# Patient Record
Sex: Male | Born: 1955 | Race: White | Hispanic: No | Marital: Married | Smoking: Former smoker
Health system: Southern US, Community
[De-identification: ages and names within clinical notes are randomized; demographics above are authoritative.]

## PROBLEM LIST (undated history)

## (undated) DIAGNOSIS — C44310 Basal cell carcinoma of skin of unspecified parts of face: Secondary | ICD-10-CM

## (undated) DIAGNOSIS — C4490 Unspecified malignant neoplasm of skin, unspecified: Secondary | ICD-10-CM

## (undated) DIAGNOSIS — K5730 Diverticulosis of large intestine without perforation or abscess without bleeding: Secondary | ICD-10-CM

## (undated) DIAGNOSIS — M545 Low back pain: Secondary | ICD-10-CM

## (undated) DIAGNOSIS — M5137 Other intervertebral disc degeneration, lumbosacral region: Secondary | ICD-10-CM

## (undated) DIAGNOSIS — C4401 Basal cell carcinoma of skin of lip: Secondary | ICD-10-CM

## (undated) HISTORY — DX: Unspecified malignant neoplasm of skin, unspecified: C44.90

## (undated) HISTORY — DX: Low back pain: M54.5

## (undated) HISTORY — DX: Diverticulosis of large intestine without perforation or abscess without bleeding: K57.30

## (undated) HISTORY — PX: INGUINAL HERNIA REPAIR: SHX194

## (undated) HISTORY — DX: Other intervertebral disc degeneration, lumbosacral region: M51.37

## (undated) HISTORY — DX: Basal cell carcinoma of skin of unspecified parts of face: C44.310

## (undated) HISTORY — PX: OTHER SURGICAL HISTORY: SHX169

## (undated) HISTORY — PX: COLONOSCOPY: SHX174

## (undated) HISTORY — PX: TONSILLECTOMY AND ADENOIDECTOMY: SHX28

---
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
## 2004-07-10 ENCOUNTER — Ambulatory Visit: Payer: Self-pay | Admitting: Internal Medicine

## 2004-09-20 ENCOUNTER — Ambulatory Visit: Payer: Self-pay | Admitting: Internal Medicine

## 2004-11-11 ENCOUNTER — Ambulatory Visit: Payer: Self-pay | Admitting: Internal Medicine

## 2006-07-21 ENCOUNTER — Ambulatory Visit: Payer: Self-pay | Admitting: Internal Medicine

## 2007-11-03 ENCOUNTER — Encounter: Payer: Self-pay | Admitting: *Deleted

## 2007-11-03 DIAGNOSIS — K5730 Diverticulosis of large intestine without perforation or abscess without bleeding: Secondary | ICD-10-CM

## 2007-11-03 DIAGNOSIS — Z9889 Other specified postprocedural states: Secondary | ICD-10-CM | POA: Insufficient documentation

## 2007-11-03 DIAGNOSIS — Z9089 Acquired absence of other organs: Secondary | ICD-10-CM | POA: Insufficient documentation

## 2007-11-03 HISTORY — DX: Diverticulosis of large intestine without perforation or abscess without bleeding: K57.30

## 2008-03-28 ENCOUNTER — Encounter: Payer: Self-pay | Admitting: Internal Medicine

## 2008-09-07 ENCOUNTER — Ambulatory Visit: Payer: Self-pay | Admitting: Internal Medicine

## 2008-09-07 DIAGNOSIS — M51379 Other intervertebral disc degeneration, lumbosacral region without mention of lumbar back pain or lower extremity pain: Secondary | ICD-10-CM | POA: Insufficient documentation

## 2008-09-07 DIAGNOSIS — C4490 Unspecified malignant neoplasm of skin, unspecified: Secondary | ICD-10-CM

## 2008-09-07 DIAGNOSIS — M5137 Other intervertebral disc degeneration, lumbosacral region: Secondary | ICD-10-CM

## 2008-09-07 HISTORY — DX: Unspecified malignant neoplasm of skin, unspecified: C44.90

## 2008-09-07 HISTORY — DX: Other intervertebral disc degeneration, lumbosacral region: M51.37

## 2008-09-07 HISTORY — DX: Other intervertebral disc degeneration, lumbosacral region without mention of lumbar back pain or lower extremity pain: M51.379

## 2008-09-07 LAB — CONVERTED CEMR LAB
ALT: 16 units/L (ref 0–53)
Bilirubin, Direct: 0.1 mg/dL (ref 0.0–0.3)
CO2: 30 meq/L (ref 19–32)
Calcium: 9.5 mg/dL (ref 8.4–10.5)
Creatinine, Ser: 0.9 mg/dL (ref 0.4–1.5)
Glucose, Bld: 93 mg/dL (ref 70–99)
Sodium: 141 meq/L (ref 135–145)
Total Protein: 6.8 g/dL (ref 6.0–8.3)

## 2008-11-27 ENCOUNTER — Encounter: Payer: Self-pay | Admitting: Internal Medicine

## 2008-12-18 ENCOUNTER — Ambulatory Visit: Payer: Self-pay | Admitting: Internal Medicine

## 2008-12-18 DIAGNOSIS — M545 Low back pain, unspecified: Secondary | ICD-10-CM

## 2008-12-18 HISTORY — DX: Low back pain, unspecified: M54.50

## 2008-12-21 ENCOUNTER — Ambulatory Visit: Payer: Self-pay | Admitting: Internal Medicine

## 2008-12-24 ENCOUNTER — Encounter: Payer: Self-pay | Admitting: Internal Medicine

## 2010-08-14 IMAGING — CR DG LUMBAR SPINE COMPLETE W/ BEND
7 series · 7 of 7 positions shown · non-contrast
Comparison: None

CLINICAL DATA: Chronic low back pain, no recent injury

LUMBAR SPINE - COMPLETE WITH BENDING VIEWS

[view not recorded (1 of 7)]
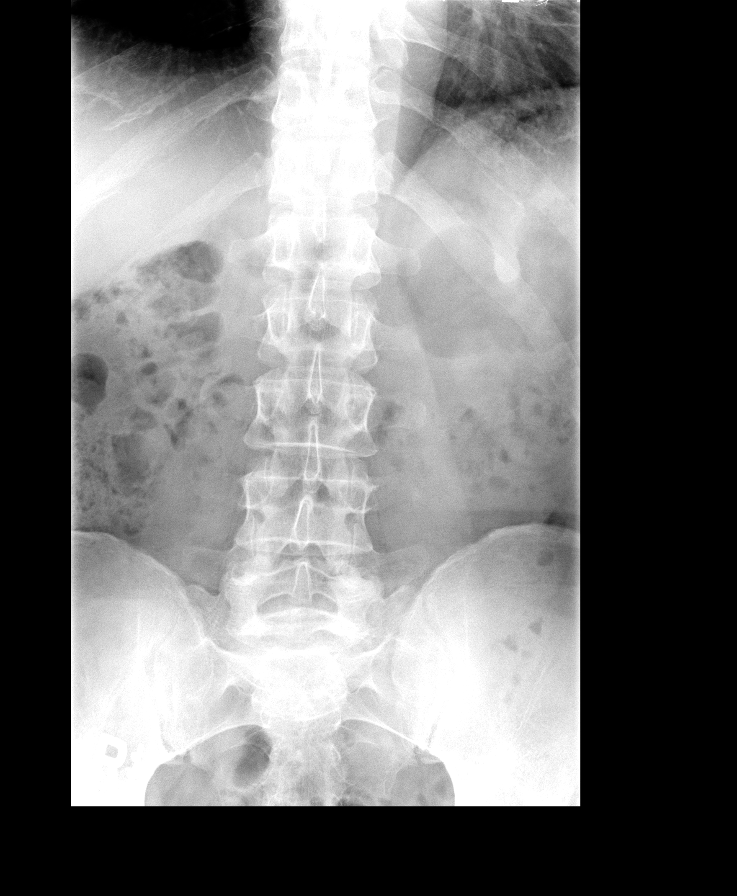

[view not recorded (2 of 7)]
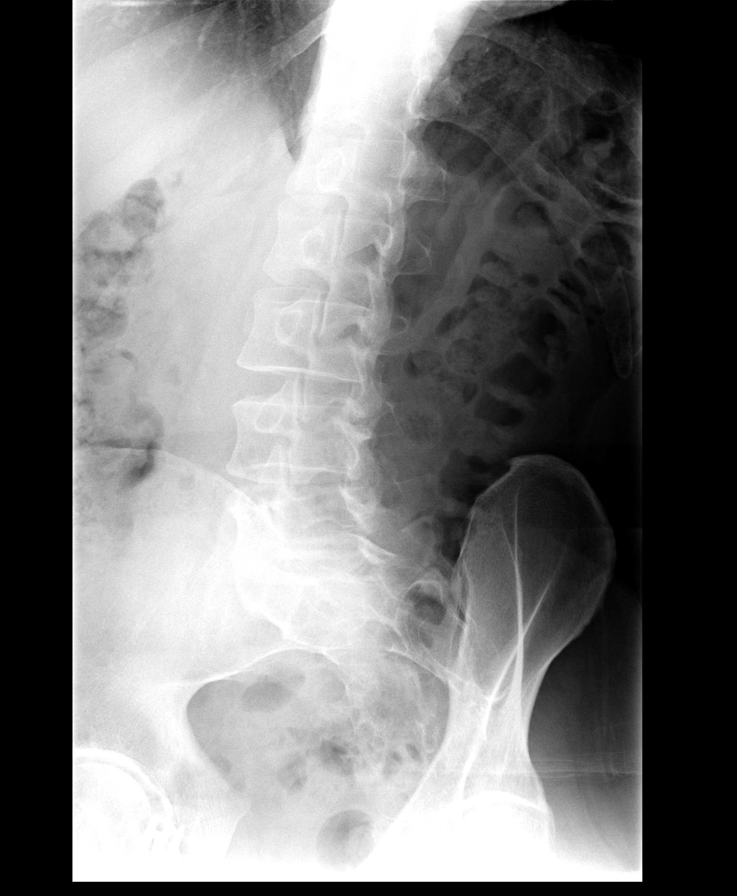

[view not recorded (3 of 7)]
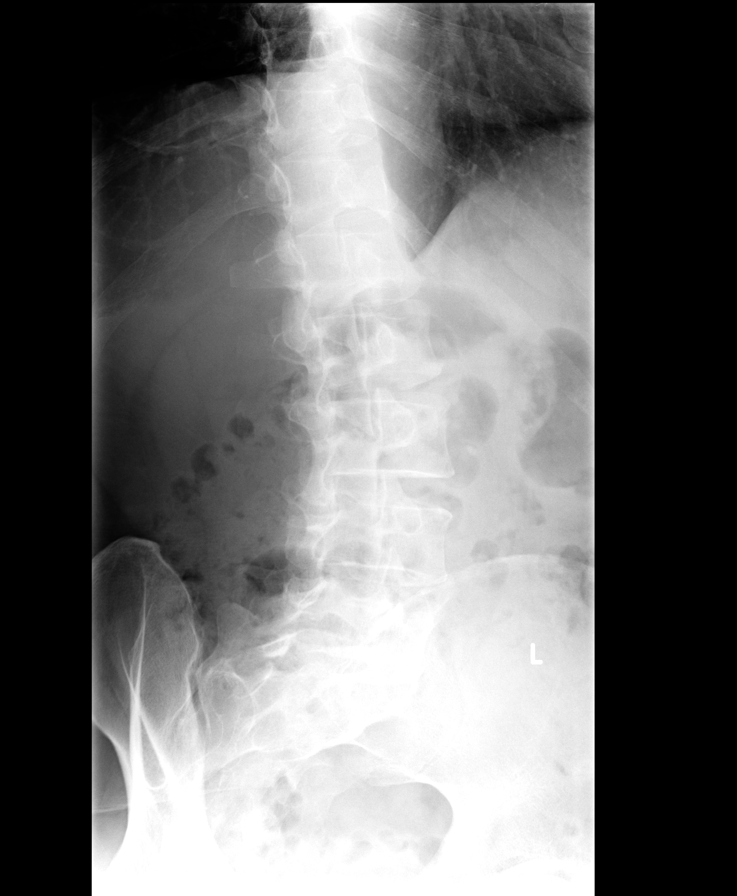

[view not recorded (4 of 7)]
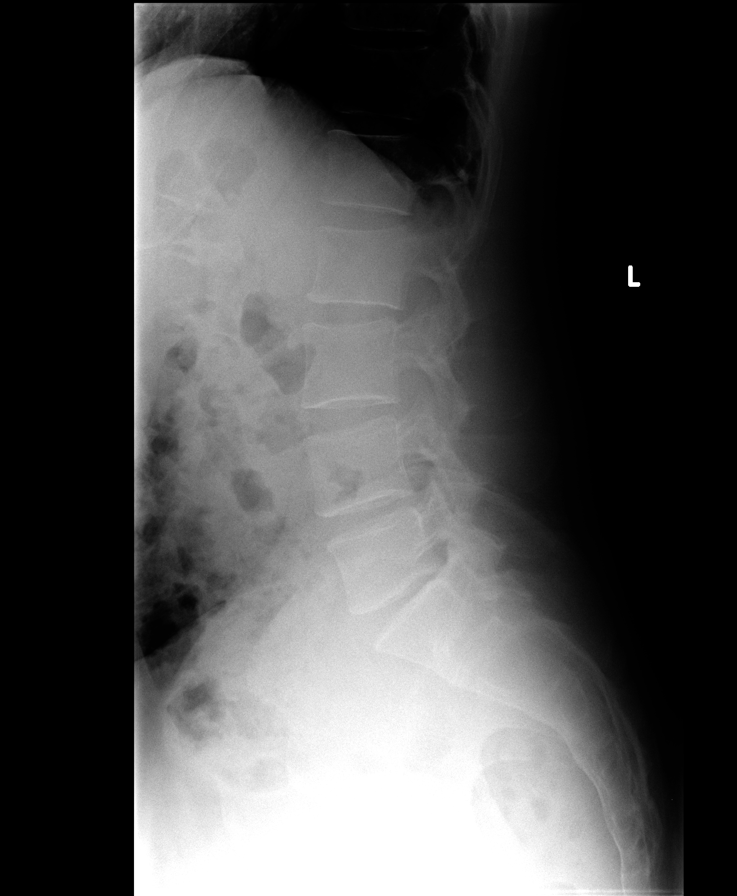

[view not recorded (5 of 7)]
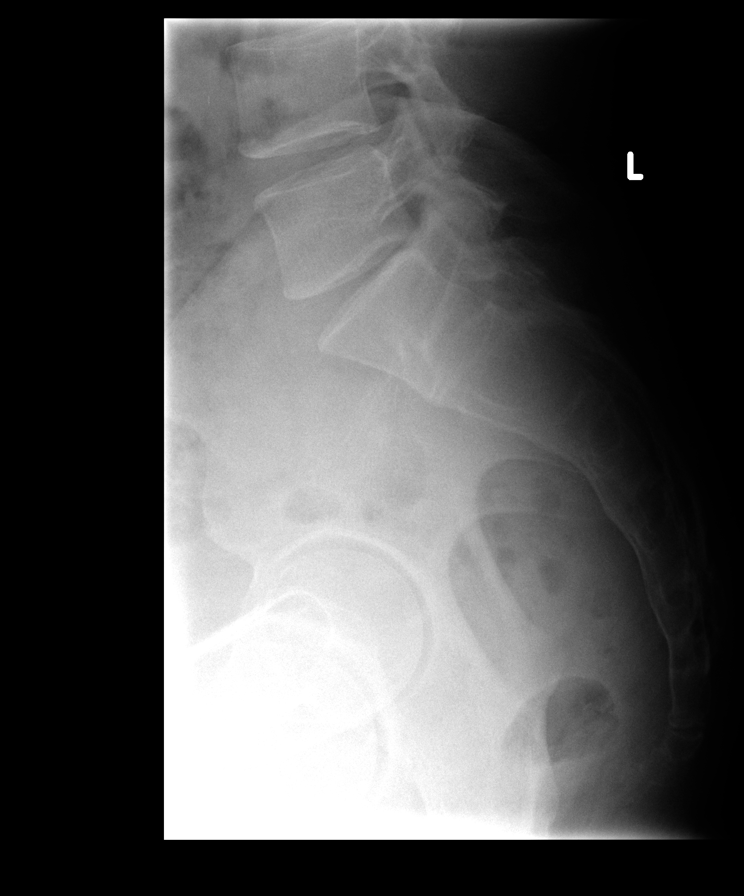

[view not recorded (6 of 7)]
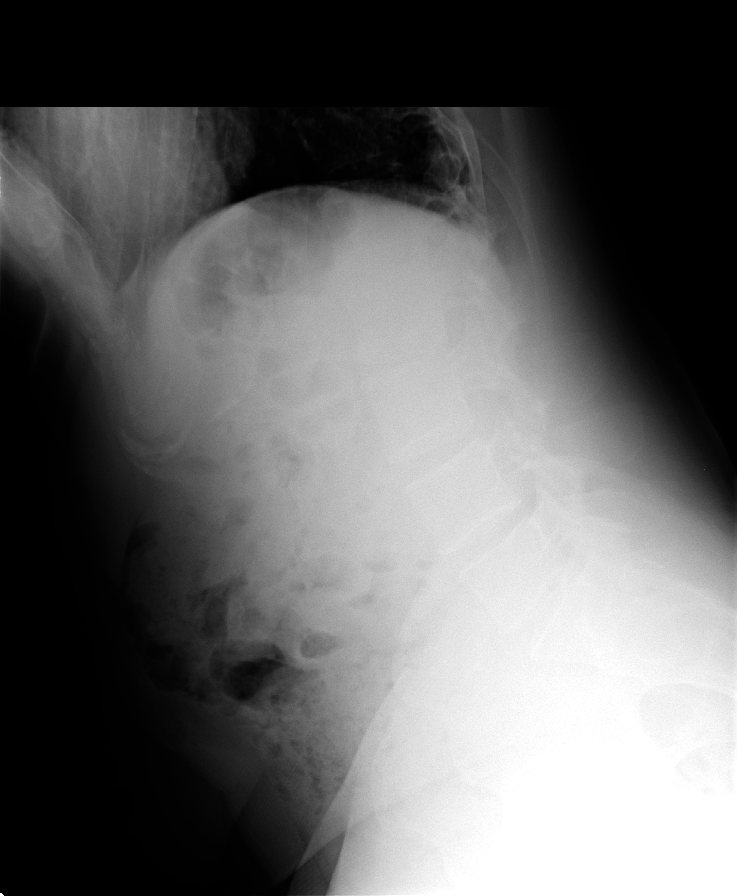

[view not recorded (7 of 7)]
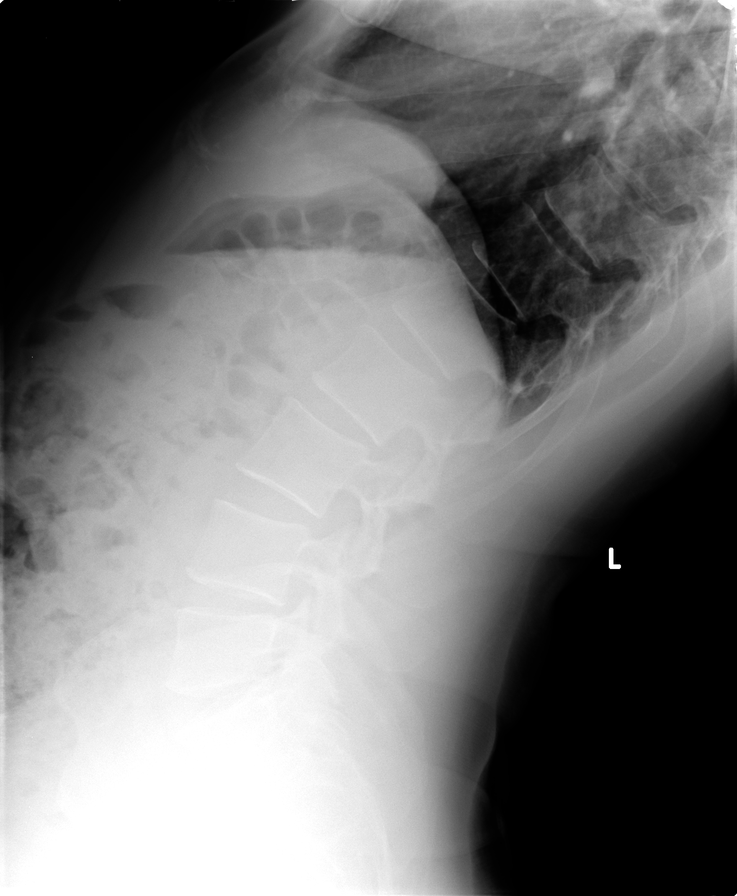

[7 of 7 positions shown; findings below may reference images not displayed]

FINDINGS: The lumbar vertebrae are in normal alignment.  There is
slight decrease in disc space at the L5 S1 level.  The remainder of
disc spaces are normal.  On oblique views no significant
degenerative change is seen involving the facet joints.  The SI
joints appear normal.

Through flexion and extension there is normal range of motion with
no malalignment.
IMPRESSION: 1.  Mild degenerative disc disease at L5-S1.  Normal alignment.
2.  Normal range of motion through flexion and extension.

## 2011-03-11 ENCOUNTER — Telehealth: Payer: Self-pay | Admitting: *Deleted

## 2011-03-11 NOTE — Telephone Encounter (Signed)
Pt is wanting to have his physical the same day as his wife would you allow a work in on Aug 23. You already have two physicals that afternoon. Please advise

## 2011-03-11 NOTE — Telephone Encounter (Signed)
Sure, why not 

## 2011-03-13 NOTE — Telephone Encounter (Signed)
Pt informed

## 2011-04-10 ENCOUNTER — Encounter: Payer: Self-pay | Admitting: Internal Medicine

## 2011-04-10 ENCOUNTER — Ambulatory Visit (INDEPENDENT_AMBULATORY_CARE_PROVIDER_SITE_OTHER): Payer: PRIVATE HEALTH INSURANCE | Admitting: Internal Medicine

## 2011-04-10 VITALS — BP 100/64 | HR 56 | Temp 98.6°F | Wt 192.0 lb

## 2011-04-10 DIAGNOSIS — Z0000 Encounter for general adult medical examination without abnormal findings: Secondary | ICD-10-CM

## 2011-04-10 DIAGNOSIS — Z136 Encounter for screening for cardiovascular disorders: Secondary | ICD-10-CM

## 2011-04-10 DIAGNOSIS — C4490 Unspecified malignant neoplasm of skin, unspecified: Secondary | ICD-10-CM

## 2011-04-10 LAB — URINALYSIS
Bilirubin (Urine): NEGATIVE
Ketones, urine: NEGATIVE
Nitrite, UA: NEGATIVE
Urobilinogen, Ur: 0.2
pH, Urine: 7.5

## 2011-04-10 LAB — CBC WITH DIFFERENTIAL/PLATELET
Lymphocytes Absolute: 1 /uL
MCH: 32.2
Monocytes Absolute: 0 /uL
Neutrophils Absolute: 2 /uL
Platelets: 179
RDW: 13.5

## 2011-04-10 LAB — TSH
Free Thyroxine Index: 2.3
T3 Uptake: 35
TSH: 1.98
Thyroxine (T4), Neonatal: 6.6

## 2011-04-10 LAB — COMPREHENSIVE METABOLIC PANEL
ALT: 13 U/L (ref 10–40)
AST: 25 U/L
Albumin: 4.8
Alkaline Phosphatase: 54 U/L
BUN/Creatinine Ratio: 13
Calcium: 9.3 mg/dL
Chloride, Serum: 9.3
GGT: 20
Glucose, Fasting: 93 mg/dL (ref 60–109)
LDH 1: 180
Potassium, serum: 4.6

## 2011-04-10 LAB — LIPID PANEL
Chol/HDL Ratio, serum: 2.7
Triglycerides: 86

## 2011-04-10 NOTE — Progress Notes (Signed)
Subjective:    Patient ID: [name] [name], male    DOB: [birth year], 55 y.o.   MRN: [id number]  HPI [name] [name] presents for routine exam. He has been healthy with no problems, injury or surgery.  Past Medical History  Diagnosis Date  . CARCINOMA, BASAL CELL, RECURRENT 09/07/2008  . 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 DIVERTICULOSIS, COLON 11/03/2007  . LOW BACK PAIN, CHRONIC 12/18/2008   Past Surgical History  Procedure Date  . Tonsillectomy and adenoidectomy     age 57  . Inguinal hernia repair     left at age 7 (concomittently with exploratory surgery)  . Exploration for undescended testicle      age 26  . Fractured wrist     at 11  . Fractured patella     teenager   Family History  Problem Relation Age of Onset  . Hyperlipidemia Mother   . Hypertension Father   . Atrial fibrillation Father   . Obesity Father   . Heart disease Father     a. fib  . Cancer Maternal Grandmother     Breast Cancer  . Hyperlipidemia Other   . Diabetes Other    History   Social History  . Marital Status: Single    Spouse Name: N/A    Number of Children: N/A  . Years of Education: N/A   Occupational History  . 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 Smoking status: Former Smoker    Quit date: 04/09/1977  . Smokeless tobacco: Never Used  . Alcohol Use: 10.5 oz/week    21 drink(s) per week  . Drug Use: No  . Sexually Active: Yes -- Male partner(s)   Other Topics Concern  . Not on file   Social History Narrative   [place]-degree Scientist, clinical (histocompatibility and immunogenetics); MBA [employer]. Married '79. 1 daughter -'81 (nurse), 1 son '88. [employer]. 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 Marriage in good health       Review of Systems Review of Systems  Constitutional:  Negative for fever, chills, activity change and unexpected weight change.  HEENT:  Negative for hearing loss, ear pain, congestion, neck  stiffness and postnasal drip. Negative for sore throat or swallowing problems. Negative for dental complaints.   Eyes: Negative for vision loss or change in visual acuity.  Respiratory: Negative for chest tightness and wheezing.   Cardiovascular: Negative for chest pain and palpitation. No decreased exercise tolerance Gastrointestinal: No change in bowel habit. No bloating or gas. No reflux or indigestion Genitourinary: Negative for urgency, frequency, flank pain and difficulty urinating.  Musculoskeletal: Negative for myalgias, back pain, arthralgias and gait problem.  Neurological: Negative for dizziness, tremors, weakness and headaches.  Hematological: Negative for adenopathy.  Psychiatric/Behavioral: Negative for behavioral problems and dysphoric mood.  Derm - several lesions removed lately - basal cell; going for Moh's procedure for lesion right temple  Lab Results  Component Value Date   WBC 3.5 03/26/2011   HGB 14.5 03/26/2011   HCT 42 03/26/2011   TRIG 86 04/10/2011   HDL 77* 04/10/2011   ALT 13 03/26/2011   AST 25 03/26/2011   NA 141 09/07/2008   K 4.2 09/07/2008   CL 104 09/07/2008   CREATININE 1.00 03/26/2011   BUN 13 03/26/2011   CO2 30 09/07/2008   TSH 1.980 03/26/2011   GLUF 93 03/26/2011        Iron  156       LDL                       960  12 lead EKG - sinus bradycardia @ 43, 2nd degree AVB     Objective:   Physical Exam Vital signs reviewed Gen'l: Well nourished well developed white male in no acute distress  HEENT:  Head: Normocephalic and atraumatic.  Right Ear: External ear normal. EAC/TM nl Left Ear: External ear normal.  EAC/TM nl Nose: Nose normal.  Mouth/Throat: Oropharynx is clear and moist. Dentition - native, in good repair. No buccal or palatal lesions. Posterior pharynx clear. Eyes: Conjunctivae and sclera clear. EOM intact. Pupils are equal, round, and reactive to light. Right eye exhibits no discharge. Left eye exhibits no discharge. Neck:  Normal range of motion. Neck supple. No JVD present. No tracheal deviation present. No thyromegaly present.  Cardiovascular: Normal rate, regular rhythm, no gallop, no friction rub, no murmur heard.      Quiet precordium. 2+ radial and DP pulses . No carotid bruits Pulmonary/Chest: Effort normal. No respiratory distress or increased WOB, no wheezes, no rales. No chest wall deformity or CVAT. Abdominal: Soft. Bowel sounds are normal in all quadrants. He exhibits no distension, no tenderness, no rebound or guarding, No heptosplenomegaly  Genitourinary:  normal male Rectal - NST, internal hemorrhoids, prostate nl size, contour, texture Musculoskeletal: Normal range of motion. He exhibits no edema and no tenderness.       Small and large joints without redness, synovial thickening or deformity. Full range of motion preserved about all small, median and large joints.  Lymphadenopathy:    He has no cervical or supraclavicular adenopathy.  Neurological: He is alert and oriented to person, place, and time. CN II-XII intact. DTRs 2+ and symmetrical biceps, radial and patellar tendons. Cerebellar function normal with no tremor, rigidity, normal gait and station.  Skin: Skin is warm and dry. No rash noted. No erythema.  Psychiatric: He has a normal mood and affect. His behavior is normal. Thought content normal.         Assessment & Plan:

## 2011-04-13 DIAGNOSIS — Z0000 Encounter for general adult medical examination without abnormal findings: Secondary | ICD-10-CM | POA: Insufficient documentation

## 2011-04-13 NOTE — Assessment & Plan Note (Signed)
He is current with his dermatologist and is scheduled for a Moh's excision of lesion on his right forehead. Explained Moh's procedure to him.

## 2011-04-13 NOTE — Assessment & Plan Note (Signed)
Interval history significant for recurrent skin lesions but no other medical problems. Physical exam is unremarkable. Lab results are in normal limits. He is current with colorectal cancer screening - last study March '06. Immunizations - he is due for Tdap, which I believe he has had this year at the [facility] department. 12 lead EKG with 2nd degree AV block. This was discussed and he is reassured that this is not, in itself, pathologic. Recommended a repeat EKG in 1 year and he is advised to be alert to symptoms of near-syncope, decreased exercise tolerance, bradycardia. He is to return in 1 year for EKG and limited exam and to return in 2 years for full labs and complete exam.

## 2011-04-28 ENCOUNTER — Encounter: Payer: Self-pay | Admitting: Internal Medicine

## 2012-07-29 ENCOUNTER — Ambulatory Visit (INDEPENDENT_AMBULATORY_CARE_PROVIDER_SITE_OTHER): Payer: PRIVATE HEALTH INSURANCE | Admitting: Internal Medicine

## 2012-07-29 ENCOUNTER — Encounter: Payer: Self-pay | Admitting: Internal Medicine

## 2012-07-29 VITALS — BP 120/72 | HR 84 | Temp 96.9°F | Resp 10 | Ht 69.0 in | Wt 182.0 lb

## 2012-07-29 DIAGNOSIS — M545 Low back pain: Secondary | ICD-10-CM

## 2012-07-29 DIAGNOSIS — Z0000 Encounter for general adult medical examination without abnormal findings: Secondary | ICD-10-CM

## 2012-07-29 DIAGNOSIS — M25512 Pain in left shoulder: Secondary | ICD-10-CM

## 2012-07-29 DIAGNOSIS — M25519 Pain in unspecified shoulder: Secondary | ICD-10-CM

## 2012-07-29 DIAGNOSIS — C4490 Unspecified malignant neoplasm of skin, unspecified: Secondary | ICD-10-CM

## 2012-07-29 NOTE — Progress Notes (Signed)
Subjective:    Patient ID: [name] [name], male    DOB: 11/14/1955, 56 y.o.   MRN: [id number]  HPI [name] [name] presents for routine medical exam. In the interval since his last visit he has had persistent pain in the shoulders more right then left. He has limited ROM particularly with adduction. He has continued his usual activities but still will have pain with some movements. He has had nocturnal pain as well. He has otherwise been doing well. Reviewed note from last August - -plan is for EKG to follow up on sinus bradycardia and 2nd degree AVB.  In the interval he has had 6 basal carcinoma excisions from right to over right eye, at glabella, and over left brow and at left temple.  In the interval he has had no cardiac symptoms: no chest pain, light-headness, syncope or near syncope, chest pain.   Past Medical History  Diagnosis Date  . CARCINOMA, BASAL CELL, RECURRENT 09/07/2008  . 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 DIVERTICULOSIS, COLON 11/03/2007  . LOW BACK PAIN, CHRONIC 12/18/2008  . Basal cell carcinoma of face     5 of the face   Past Surgical History  Procedure Date  . Tonsillectomy and adenoidectomy     age 44  . Inguinal hernia repair     left at age 66 (concomittently with exploratory surgery)  . Exploration for undescended testicle      age 24  . Fractured wrist     at 11  . Fractured patella     teenager   Family History  Problem Relation Age of Onset  . Hyperlipidemia Mother   . Hypertension Father   . Atrial fibrillation Father   . Obesity Father   . Heart disease Father     a. fib  . Cancer Maternal Grandmother     Breast Cancer  . Hyperlipidemia Other   . Diabetes Other    History   Social History  . Marital Status: Single    Spouse Name: N/A    Number of Children: N/A  . Years of Education: N/A   Occupational History  . 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 Smoking status: Former Smoker    Quit  date: 04/09/1977  . Smokeless tobacco: Never Used  . Alcohol Use: 1.5 oz/week    3 drink(s) per week  . Drug Use: No  . Sexually Active: Yes -- Male partner(s)   Other Topics Concern  . Not on file   Social History Narrative   [place]-degree Scientist, clinical (histocompatibility and immunogenetics); MBA [employer]. Married '79. 1 daughter -'40 (nurse), 1 son '88. [employer]. 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 Marriage in good health    Current Outpatient Prescriptions on File Prior to Visit  Medication Sig Dispense Refill  . etodolac (LODINE) 400 MG tablet Take 400 mg by mouth 2 (two) times daily.        . SUMAtriptan (IMITREX) 100 MG tablet Take 100 mg by mouth as needed.            Review of Systems System review is negative for any constitutional, cardiac, pulmonary, GI or neuro symptoms or complaints other than as described in the HPI.     Objective:   Physical Exam Filed Vitals:   07/29/12 1002  BP: 120/72  Pulse: 84  Temp: 96.9 F (36.1 C)  Resp: 10   Wt Readings from Last 3 Encounters:  07/29/12  182 lb 0.6 oz (82.573 kg)  04/10/11 192 lb (87.091 kg)  12/18/08 180 lb 8 oz (81.874 kg)   Gen'l- WNWD white man in no distress HEENT - C&S clear, PERRLA Neck- supple Nodes - negative Cor- 2+ radial, RRR Pulm - normal respirations MSK - shoulders with 90% normal ROM actively; no click, no crepitus. Medium and small joints normal  12 lead EKG: Sinus bradycardia, PR 146     Assessment & Plan:  Shoulder pain - having pain x 3 + months. On exam well preserved ROM. History and exam consistent with bursititis  Plan  PT referral to [facility].

## 2012-07-31 NOTE — Assessment & Plan Note (Signed)
In the interval since his last visit - excision of 5+ lesions

## 2012-07-31 NOTE — Assessment & Plan Note (Signed)
Stable chronic back issues - better when he stretches/exercises his back every day.

## 2012-07-31 NOTE — Assessment & Plan Note (Signed)
Interval history is benign except for shoulder pain. 12 Lead EKG - normal except for sinus bradycardia. Per machine read no 2nd degree AVB now vs August '12.  He is asked to return in 18-24 months for full exam with lab.

## 2012-10-11 ENCOUNTER — Telehealth: Payer: Self-pay | Admitting: General Practice

## 2012-10-11 DIAGNOSIS — M25519 Pain in unspecified shoulder: Secondary | ICD-10-CM

## 2012-10-11 NOTE — Telephone Encounter (Signed)
Referral in to [facility]

## 2012-10-11 NOTE — Telephone Encounter (Signed)
Pt stated that he was in for an appt around December. Would like a referral to [facility] regarding his Shoulder pain. Please advise.

## 2012-10-12 ENCOUNTER — Telehealth: Payer: Self-pay | Admitting: Internal Medicine

## 2012-10-12 NOTE — Telephone Encounter (Signed)
Pt called back.  He does not want more physical therapy.  He wants to be referred to an orthopedic to see what is going on with his shoulders and wants an MRI to see what is causing the problems.  The right shoulder hurts the most, but both hurt.

## 2012-10-12 NOTE — Telephone Encounter (Signed)
Yesterday's note said renewal on referral to [facility] - sorry the message to me was wrong.  OK for referral to ortho - Dr Annell Greening. [facility] notified.

## 2012-10-13 NOTE — Telephone Encounter (Signed)
Pt is aware.  

## 2013-12-29 ENCOUNTER — Ambulatory Visit (INDEPENDENT_AMBULATORY_CARE_PROVIDER_SITE_OTHER): Payer: PRIVATE HEALTH INSURANCE | Admitting: Internal Medicine

## 2013-12-29 ENCOUNTER — Encounter: Payer: Self-pay | Admitting: Internal Medicine

## 2013-12-29 VITALS — BP 116/76 | HR 60 | Temp 98.5°F | Ht 67.5 in | Wt 183.8 lb

## 2013-12-29 DIAGNOSIS — Z0000 Encounter for general adult medical examination without abnormal findings: Secondary | ICD-10-CM

## 2013-12-29 NOTE — Progress Notes (Signed)
Pre visit review using our clinic review tool, if applicable. No additional management support is needed unless otherwise documented below in the visit note. 

## 2013-12-29 NOTE — Progress Notes (Signed)
HPI  Pt presents to the clinic today to establish care. He is transferring care from Dr. Linda Hedges. 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  He does have some concerns today about his prior ECG. 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 Recurrent ECG in 2013 was normal. He would like it repeated today. He denies chest pain, chest tightness or shortness of breath.  Flu: 06/16/13 Tetanus: < 10 years ago PSA screen:  never Colon Screening: 2006, due next year Eye doctor: yearly Dentist: biannually  Past Medical History  Diagnosis Date  . CARCINOMA, BASAL CELL, RECURRENT 09/07/2008  . 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 DIVERTICULOSIS, COLON 11/03/2007  . LOW BACK PAIN, CHRONIC 12/18/2008  . Basal cell carcinoma of face     5 of the face    Current Outpatient Prescriptions  Medication Sig Dispense Refill  . fluticasone (FLONASE) 50 MCG/ACT nasal spray Place 2 sprays into both nostrils daily.       No current facility-administered medications for this visit.    Allergies  Allergen Reactions  . Amoxicillin-Pot Clavulanate     REACTION: casues upset stomach    Family History  Problem Relation Age of Onset  . Hyperlipidemia Mother   . Hypertension Father   . Atrial fibrillation Father   . Obesity Father   . Heart disease Father     a. fib  . Cancer Maternal Grandmother     Uterine  . Hyperlipidemia Other   . Diabetes Other   . Cancer Paternal Grandmother     Breast  . Stroke Paternal Grandmother     History   Social History  . Marital Status: Single    Spouse Name: N/A    Number of Children: N/A  . Years of Education: N/A   Occupational History  . [employer]    Social History Main Topics  . Smoking status: Former Smoker    Quit date: 04/09/1977  . Smokeless tobacco: Never Used  . Alcohol Use: 1.5 oz/week    3 drink(s) per week     Comment: moderate  . Drug Use: No   . Sexual Activity: Yes    Partners: Female   Other Topics Concern  . Not on file   Social History Narrative   [place]; MBA [employer]. Married '79. 1 daughter -'72 (nurse), 1 son '88. [employer]. 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 Marriage in good health             ROS:  Constitutional: Denies fever, malaise, fatigue, headache or abrupt weight changes.  HEENT: Denies eye pain, eye redness, ear pain, ringing in the ears, wax buildup, runny nose, nasal congestion, bloody nose, or sore throat. Respiratory: Denies difficulty breathing, shortness of breath, cough or sputum production.   Cardiovascular: Denies chest pain, chest tightness, palpitations or swelling in the hands or feet.  Gastrointestinal: Denies abdominal pain, bloating, constipation, diarrhea or blood in the stool.  GU: Denies frequency, urgency, pain with urination, blood in urine, odor or discharge. Musculoskeletal: Pt reports right shoulder pain. Denies decrease in range of motion, difficulty with gait, muscle pain or joint pain and swelling.  Skin: Denies redness, rashes, lesions or ulcercations.  Neurological: Denies dizziness, difficulty with memory, difficulty with speech or problems with balance and coordination.   No other specific complaints in a complete review of systems (except as listed in  HPI above).  PE:  BP 116/76  Pulse 60  Temp(Src) 98.5 F (36.9 C) (Oral)  Ht 5' 7.5" (1.715 m)  Wt 183 lb 12 oz (83.348 kg)  BMI 28.34 kg/m2  SpO2 98% Wt Readings from Last 3 Encounters:  12/29/13 183 lb 12 oz (83.348 kg)  07/29/12 182 lb 0.6 oz (82.573 kg)  04/10/11 192 lb (87.091 kg)    General: Appears his stated age, well developed, well nourished in NAD. HEENT: Head: normal shape and size; Eyes: sclera white, no icterus, conjunctiva pink, PERRLA and EOMs intact; Ears: Tm's gray and intact, normal light reflex; Nose: mucosa pink and  moist, septum midline; Throat/Mouth: Teeth present, mucosa pink and moist, no lesions or ulcerations noted.  Neck: Normal range of motion. Neck supple, trachea midline. No massses, lumps or thyromegaly present.  Cardiovascular: Normal rate and rhythm. S1,S2 noted.  No murmur, rubs or gallops noted. No JVD or BLE edema. No carotid bruits noted. Pulmonary/Chest: Normal effort and positive vesicular breath sounds. No respiratory distress. No wheezes, rales or ronchi noted.  Abdomen: Soft and nontender. Normal bowel sounds, no bruits noted. No distention or masses noted. Liver, spleen and kidneys non palpable. Musculoskeletal: Normal range of motion. No signs of joint swelling. No difficulty with gait.  Neurological: Alert and oriented. Cranial nerves II-XII intact. Coordination normal. +DTRs bilaterally. Psychiatric: Mood and affect normal. Behavior is normal. Judgment and thought content normal.   EKG:  BMET    Component Value Date/Time   NA 141 09/07/2008 1424   K 4.2 09/07/2008 1424   CL 104 09/07/2008 1424   CO2 30 09/07/2008 1424   GLUCOSE 93 09/07/2008 1424   BUN 13 03/26/2011   BUN 12 09/07/2008 1424   CREATININE 1.00 03/26/2011   CALCIUM 9.3 03/26/2011   CALCIUM 9.5 09/07/2008 1424   GFRNONAA 93 09/07/2008 1424   GFRAA 113 09/07/2008 1424    Lipid Panel     Component Value Date/Time   TRIG 86 04/10/2011 1355   HDL 77* 04/10/2011 1355   LDLCALC 113 04/10/2011 1355    CBC    Component Value Date/Time   WBC 3.5 03/26/2011   RBC 4.50 03/26/2011   HGB 14.5 03/26/2011   HCT 42 03/26/2011   MCV 94.0 03/26/2011   MCH 32.2 03/26/2011   RDW 13.5 03/26/2011   LYMPHSABS 1 03/26/2011   MONOABS 0 03/26/2011    Hgb A1C No results found for this basename: HGBA1C     Assessment and Plan:  Preventative Health Maintenance:  He has his labs done yearly at the health department Discussed PSA screen- he would like to have it done Encouraged him to work on diet and exercise ECG today  RTC in 1 year or  sooner if needed

## 2013-12-29 NOTE — Addendum Note (Signed)
Addended by: Lurlean Nanny on: 12/29/2013 02:08 PM   Modules accepted: Orders

## 2013-12-29 NOTE — Patient Instructions (Addendum)

## 2014-01-03 ENCOUNTER — Ambulatory Visit: Payer: PRIVATE HEALTH INSURANCE | Admitting: Internal Medicine

## 2014-02-07 DIAGNOSIS — Z005 Encounter for examination of potential donor of organ and tissue: Secondary | ICD-10-CM | POA: Insufficient documentation

## 2014-02-09 ENCOUNTER — Encounter: Payer: Self-pay | Admitting: Internal Medicine

## 2014-03-23 ENCOUNTER — Encounter: Payer: Self-pay | Admitting: Internal Medicine

## 2014-06-02 ENCOUNTER — Other Ambulatory Visit: Payer: Self-pay

## 2014-11-06 ENCOUNTER — Encounter: Payer: Self-pay | Admitting: Internal Medicine

## 2015-01-16 ENCOUNTER — Encounter: Payer: Self-pay | Admitting: Internal Medicine

## 2015-03-26 ENCOUNTER — Ambulatory Visit (AMBULATORY_SURGERY_CENTER): Payer: Self-pay

## 2015-03-26 VITALS — Ht 69.0 in | Wt 189.0 lb

## 2015-03-26 DIAGNOSIS — Z1211 Encounter for screening for malignant neoplasm of colon: Secondary | ICD-10-CM

## 2015-03-26 NOTE — Progress Notes (Signed)
No allergies to eggs or soy No diet/weight loss meds No home oxygen No past problems with anesthesia  Refused Emmi 

## 2015-04-05 ENCOUNTER — Encounter: Payer: Self-pay | Admitting: Internal Medicine

## 2015-04-09 ENCOUNTER — Ambulatory Visit (AMBULATORY_SURGERY_CENTER): Payer: PRIVATE HEALTH INSURANCE | Admitting: Internal Medicine

## 2015-04-09 ENCOUNTER — Encounter: Payer: Self-pay | Admitting: Internal Medicine

## 2015-04-09 VITALS — BP 109/59 | HR 50 | Temp 96.2°F | Resp 15 | Ht 69.0 in | Wt 180.0 lb

## 2015-04-09 DIAGNOSIS — Z1211 Encounter for screening for malignant neoplasm of colon: Secondary | ICD-10-CM

## 2015-04-09 HISTORY — PX: COLONOSCOPY: SHX174

## 2015-04-09 MED ORDER — SODIUM CHLORIDE 0.9 % IV SOLN: 500.0000 mL | INTRAVENOUS | Status: DC

## 2015-04-09 NOTE — Progress Notes (Signed)
To recovery, report to Brown, RN, VSS. 

## 2015-04-09 NOTE — Op Note (Signed)
[facility]  [address]. [place], [id number]   COLONOSCOPY PROCEDURE REPORT  PATIENT: [name] [name]  MR#: [id number] BIRTHDATE: 1956-07-22 , 59  yrs. old GENDER: male ENDOSCOPIST: Gatha Mayer, MD, [facility] PROCEDURE DATE:  04/09/2015 PROCEDURE:   Colonoscopy, screening First Screening Colonoscopy - Avg.  risk and is 50 yrs.  old or older - No.  Prior Negative Screening - Now for repeat screening. 10 or more years since last screening  History of Adenoma - Now for follow-up colonoscopy & has been > or = to 3 yrs.  N/A  Polyps removed today? No Recommend repeat exam, <10 yrs? No ASA CLASS:   Class II INDICATIONS:Screening for colonic neoplasia and Colorectal Neoplasm Risk Assessment for this procedure is average risk. MEDICATIONS: Propofol 250 mg IV and Monitored anesthesia care  DESCRIPTION OF PROCEDURE:   After the risks benefits and alternatives of the procedure were thoroughly explained, informed consent was obtained.  The digital rectal exam revealed no abnormalities of the rectum, revealed no prostatic nodules, and revealed the prostate was not enlarged.   The [id number] endoscope was introduced through the anus and advanced to the cecum, which was identified by both the appendix and ileocecal valve. No adverse events experienced.   The quality of the prep was excellent.  (MiraLax was used)  The instrument was then slowly withdrawn as the colon was fully examined. Estimated blood loss is zero unless otherwise noted in this procedure report.      COLON FINDINGS: There was diverticulosis noted in the sigmoid colon. The examination was otherwise normal.   Right colon retroflexion included.  Retroflexed views revealed no abnormalities. The time to cecum = 3.8 Withdrawal time = 9.0   The scope was withdrawn and the procedure completed. COMPLICATIONS: There were no immediate complications.  ENDOSCOPIC IMPRESSION: 1.   Diverticulosis was noted in  the sigmoid colon 2.   The examination was otherwise normal - excellent prep  RECOMMENDATIONS: Repeat colonoscopy 10 years.  eSigned:  Gatha Mayer, MD, [facility] 04/09/2015 8:32 AM   cc:  Webb Silversmith, NP and The Patient

## 2015-04-09 NOTE — Patient Instructions (Addendum)
No polyps again!  You do have diverticulosis - thickened muscle rings and pouches in the colon wall. Please read the handout about this condition.  Next routine colon screening in 10 years - 2026  I appreciate the opportunity to care for you. Gatha Mayer, MD, FACG     YOU HAD AN ENDOSCOPIC PROCEDURE TODAY AT [facility]:   Refer to the procedure report that was given to you for any specific questions about what was found during the examination.  If the procedure report does not answer your questions, please call your gastroenterologist to clarify.  If you requested that your care partner not be given the details of your procedure findings, then the procedure report has been included in a sealed envelope for you to review at your convenience later.  YOU SHOULD EXPECT: Some feelings of bloating in the abdomen. Passage of more gas than usual.  Walking can help get rid of the air that was put into your GI tract during the procedure and reduce the bloating. If you had a lower endoscopy (such as a colonoscopy or flexible sigmoidoscopy) you may notice spotting of blood in your stool or on the toilet paper. If you underwent a bowel prep for your procedure, you may not have a normal bowel movement for a few days.  Please Note:  You might notice some irritation and congestion in your nose or some drainage.  This is from the oxygen used during your procedure.  There is no need for concern and it should clear up in a day or so.  SYMPTOMS TO REPORT IMMEDIATELY:   Following lower endoscopy (colonoscopy or flexible sigmoidoscopy):  Excessive amounts of blood in the stool  Significant tenderness or worsening of abdominal pains  Swelling of the abdomen that is new, acute  Fever of 100F or higher   For urgent or emergent issues, a gastroenterologist can be reached at any hour by calling [phone].   DIET: Your first meal following the procedure should be a small meal  and then it is ok to progress to your normal diet. Heavy or fried foods are harder to digest and may make you feel nauseous or bloated.  Likewise, meals heavy in dairy and vegetables can increase bloating.  Drink plenty of fluids but you should avoid alcoholic beverages for 24 hours.  ACTIVITY:  You should plan to take it easy for the rest of today and you should NOT DRIVE or use heavy machinery until tomorrow (because of the sedation medicines used during the test).    FOLLOW UP: Our staff will call the number listed on your records the next business day following your procedure to check on you and address any questions or concerns that you may have regarding the information given to you following your procedure. If we do not reach you, we will leave a message.  However, if you are feeling well and you are not experiencing any problems, there is no need to return our call.  We will assume that you have returned to your regular daily activities without incident.  If any biopsies were taken you will be contacted by phone or by letter within the next 1-3 weeks.  Please call us at [phone] if you have not heard about the biopsies in 3 weeks.    SIGNATURES/CONFIDENTIALITY: You and/or your care partner have signed paperwork which will be entered into your electronic medical record.  These signatures attest to the fact that that the  information above on your After Visit Summary has been reviewed and is understood.  Full responsibility of the confidentiality of this discharge information lies with you and/or your care-partner.   Resume medications. Information given on diverticulosis.

## 2015-04-10 ENCOUNTER — Telehealth: Payer: Self-pay | Admitting: *Deleted

## 2015-04-10 NOTE — Telephone Encounter (Signed)
  Follow up Call-  Call back number 04/09/2015  Post procedure Call Back phone  # [phone]  Permission to leave phone message Yes     Patient questions:  Do you have a fever, pain , or abdominal swelling? No. Pain Score  0 *  Have you tolerated food without any problems? Yes.    Have you been able to return to your normal activities? Yes.    Do you have any questions about your discharge instructions: Diet   No. Medications  No. Follow up visit  No.  Do you have questions or concerns about your Care? No.  Actions: * If pain score is 4 or above: No action needed, pain <4.

## 2015-04-24 ENCOUNTER — Encounter: Payer: Self-pay | Admitting: Internal Medicine

## 2015-04-24 ENCOUNTER — Ambulatory Visit (INDEPENDENT_AMBULATORY_CARE_PROVIDER_SITE_OTHER): Payer: PRIVATE HEALTH INSURANCE | Admitting: Internal Medicine

## 2015-04-24 VITALS — BP 116/70 | HR 56 | Temp 98.5°F | Ht 67.66 in | Wt 189.0 lb

## 2015-04-24 DIAGNOSIS — B070 Plantar wart: Secondary | ICD-10-CM

## 2015-04-24 DIAGNOSIS — N5311 Retarded ejaculation: Secondary | ICD-10-CM

## 2015-04-24 DIAGNOSIS — M25561 Pain in right knee: Secondary | ICD-10-CM

## 2015-04-24 DIAGNOSIS — Z0000 Encounter for general adult medical examination without abnormal findings: Secondary | ICD-10-CM | POA: Diagnosis not present

## 2015-04-24 DIAGNOSIS — F5232 Male orgasmic disorder: Secondary | ICD-10-CM

## 2015-04-24 MED ORDER — TADALAFIL 5 MG PO TABS: 5.0000 mg | ORAL_TABLET | Freq: Every day | ORAL | Status: DC | PRN

## 2015-04-24 NOTE — Progress Notes (Signed)
Pre visit review using our clinic review tool, if applicable. No additional management support is needed unless otherwise documented below in the visit note. 

## 2015-04-24 NOTE — Patient Instructions (Signed)

## 2015-04-24 NOTE — Progress Notes (Signed)
Subjective:    Patient ID: [name] [name], male    DOB: 05-18-1956, 59 y.o.   MRN: [id number]  HPI  Pt presents to the clinic today for his annual exam.  Flu: 05/2014 Tetanus: 2008 Zostovax: never PSA Screening: 01/2014 Colon Screening: 03/2015 Vision Screening: 04/2015, yearly Dentist: biannually  Diet: He eats bananas daily. He does consume veggies. He tries to avoid fried foods. Exercise: He walks daily.  Review of Systems      Past Medical History  Diagnosis Date  . 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 DIVERTICULOSIS, COLON 11/03/2007  . LOW BACK PAIN, CHRONIC 12/18/2008  . CARCINOMA, BASAL CELL, RECURRENT 09/07/2008  . Basal cell carcinoma of face     5 of the face    Current Outpatient Prescriptions  Medication Sig Dispense Refill  . fluticasone (FLONASE) 50 MCG/ACT nasal spray Place 2 sprays into both nostrils as needed.      No current facility-administered medications for this visit.    Allergies  Allergen Reactions  . Amoxicillin-Pot Clavulanate     REACTION: casues upset stomach    Family History  Problem Relation Age of Onset  . Hyperlipidemia Mother   . Hypertension Father   . Atrial fibrillation Father   . Obesity Father   . Heart disease Father     a. fib  . Cancer Maternal Grandmother     Uterine  . Hyperlipidemia Other   . Diabetes Other   . Cancer Paternal Grandmother     Breast  . Stroke Paternal Grandmother   . Colon cancer Neg Hx   . Colon polyps Neg Hx     Social History   Social History  . Marital Status: Single    Spouse Name: N/A  . Number of Children: N/A  . Years of Education: N/A   Occupational History  . [employer]    Social History Main Topics  . Smoking status: Former Smoker    Quit date: 04/09/1977  . Smokeless tobacco: Never Used  . Alcohol Use: 7.2 oz/week    12 Standard drinks or equivalent per week     Comment: moderate  . Drug Use: No  . Sexual Activity:   Partners: Female   Other Topics Concern  . Not on file   Social History Narrative   [place] [employer]; MBA [employer]. Married '79. 1 daughter -'13 (nurse), 1 son '88. [employer]. 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  HEENT: Denies eye pain, eye redness, ear pain, ringing in the ears, wax buildup, runny nose, nasal congestion, bloody nose, or sore throat. Respiratory: Denies difficulty breathing, shortness of breath, cough or sputum production.   Cardiovascular: Denies chest pain, chest tightness, palpitations or swelling in the hands or feet.  Gastrointestinal: Denies abdominal pain, bloating, constipation, diarrhea or blood in the stool.  GU: Pt reports delayed ejaculation. Denies urgency, frequency, pain with urination, burning sensation, blood in urine, odor or discharge. Musculoskeletal: Pt reports right knee pain. Denies decrease in range of motion, difficulty with gait, muscle pain.  Skin: Pt reports plantar wart to bottom of left foot. Denies redness, rashes, or ulcercations.  Neurological: Denies dizziness, difficulty with memory, difficulty with speech or problems with balance and coordination.  Psych: Denies anxiety, depression, SI/HI.  No other specific complaints in  a complete review of systems (except as listed in HPI above).   Objective:   Physical Exam   BP 116/70 mmHg  Pulse 56  Temp(Src) 98.5 F (36.9 C) (Oral)  Ht 5' 7.66" (1.719 m)  Wt 189 lb (85.73 kg)  BMI 29.01 kg/m2  SpO2 98% Wt Readings from Last 3 Encounters:  04/24/15 189 lb (85.73 kg)  04/09/15 180 lb (81.647 kg)  03/26/15 189 lb (85.73 kg)    General: Appears his stated age, well developed, well nourished in NAD. Skin: Warm, dry and intact. Small plantar wart noted on the bottom of left foot. HEENT: Head: normal  shape and size; Eyes: sclera white, no icterus, conjunctiva pink, PERRLA and EOMs intact; Ears: Tm's gray and intact, normal light reflex; Throat/Mouth: Teeth present, mucosa pink and moist, no exudate, lesions or ulcerations noted.  Neck:  Neck supple, trachea midline. No masses, lumps or thyromegaly present.  Cardiovascular: Normal rate and rhythm. S1,S2 noted.  No murmur, rubs or gallops noted. No JVD or BLE edema. No carotid bruits noted. Pulmonary/Chest: Normal effort and positive vesicular breath sounds. No respiratory distress. No wheezes, rales or ronchi noted.  Abdomen: Soft and nontender. Normal bowel sounds, no bruits noted. No distention or masses noted. Liver, spleen and kidneys non palpable. Musculoskeletal: Normal range of motion. Strength 5/5 BUE/BLE. No signs of joint swelling. No difficulty with gait.  Neurological: Alert and oriented. Cranial nerves II-XII grossly intact. Coordination normal.  Psychiatric: Mood and affect normal. Behavior is normal. Judgment and thought content normal.   EKG:  BMET    Component Value Date/Time   NA 141 09/07/2008 1424   K 4.2 09/07/2008 1424   CL 104 09/07/2008 1424   CO2 30 09/07/2008 1424   GLUCOSE 93 09/07/2008 1424   BUN 13 03/26/2011   BUN 12 09/07/2008 1424   CREATININE 1.00 03/26/2011   CALCIUM 9.3 03/26/2011   CALCIUM 9.5 09/07/2008 1424   GFRNONAA 93 09/07/2008 1424   GFRAA 113 09/07/2008 1424    Lipid Panel     Component Value Date/Time   CHOL 207 04/10/2011 1355   TRIG 86 04/10/2011 1355   HDL 77* 04/10/2011 1355   CHOLHDL 2.7 04/10/2011 1355   LDLCALC 113 04/10/2011 1355    CBC    Component Value Date/Time   WBC 3.5 03/26/2011   RBC 4.50 03/26/2011   HGB 14.5 03/26/2011   HCT 42 03/26/2011   MCV 94.0 03/26/2011   MCH 32.2 03/26/2011   RDW 13.5 03/26/2011   LYMPHSABS 1 03/26/2011   MONOABS 0 03/26/2011    Hgb A1C No results found for: HGBA1C      Assessment & Plan:   Preventative Health  Maintenance:  Advised him to get a flu shot in the fall of 2016 Tetanus UTD He will call insurance to see is Zostovax is covered PSA ordered with his screening labs CBC, CMET, TSH and Lipid Profile done Colonoscopy UTD Encouraged him to continue seeing an eye doctor and dentist at least annually  Right knee pain:  ? Arthritis He declines Xray today Take Aleve OTC daily as needed  Plantar wart:  Advised him to consider seeing a podiatrist  Delayed ejaculation:  Will trial Cialis 5 mg daily prn  RTC in 1 year or sooner if needed

## 2015-04-25 ENCOUNTER — Ambulatory Visit: Payer: PRIVATE HEALTH INSURANCE

## 2015-04-25 ENCOUNTER — Encounter: Payer: Self-pay | Admitting: Internal Medicine

## 2015-06-18 ENCOUNTER — Encounter: Payer: Self-pay | Admitting: Physician Assistant

## 2015-06-18 ENCOUNTER — Ambulatory Visit: Payer: Self-pay | Admitting: Physician Assistant

## 2015-06-18 VITALS — BP 110/65 | HR 52 | Temp 98.4°F

## 2015-06-18 DIAGNOSIS — G8929 Other chronic pain: Secondary | ICD-10-CM

## 2015-06-18 DIAGNOSIS — M25561 Pain in right knee: Principal | ICD-10-CM

## 2015-06-18 NOTE — Progress Notes (Signed)
S: c/o r knee pain, hx of same since this summer, isn't very painful all of the time, just when has been on it a lot or when has been sitting for long period of time, will get "stiff" , similar problems with shoulders and cortisone injection helped, but the knee also feels a little unstable, has been wearing neoprene sleeve to help  O: vitals wnl, nad, skin intact no bruising or redness noted, r knee a little tender at medial joint line, ligaments appear intact but pt has some guarding on maneuvers, n/v intact  A: knee pain  P: f/u with sports med, pt to call and make his own appointment

## 2015-06-28 ENCOUNTER — Ambulatory Visit (INDEPENDENT_AMBULATORY_CARE_PROVIDER_SITE_OTHER): Payer: PRIVATE HEALTH INSURANCE | Admitting: Family Medicine

## 2015-06-28 ENCOUNTER — Telehealth: Payer: Self-pay | Admitting: Internal Medicine

## 2015-06-28 ENCOUNTER — Encounter: Payer: Self-pay | Admitting: *Deleted

## 2015-06-28 ENCOUNTER — Other Ambulatory Visit (INDEPENDENT_AMBULATORY_CARE_PROVIDER_SITE_OTHER): Payer: PRIVATE HEALTH INSURANCE

## 2015-06-28 ENCOUNTER — Encounter: Payer: Self-pay | Admitting: Family Medicine

## 2015-06-28 VITALS — BP 128/70 | HR 63 | Ht 68.0 in | Wt 192.0 lb

## 2015-06-28 DIAGNOSIS — S83411A Sprain of medial collateral ligament of right knee, initial encounter: Secondary | ICD-10-CM | POA: Diagnosis not present

## 2015-06-28 DIAGNOSIS — M7121 Synovial cyst of popliteal space [Baker], right knee: Secondary | ICD-10-CM | POA: Diagnosis not present

## 2015-06-28 DIAGNOSIS — M25561 Pain in right knee: Secondary | ICD-10-CM | POA: Diagnosis not present

## 2015-06-28 DIAGNOSIS — S83419A Sprain of medial collateral ligament of unspecified knee, initial encounter: Secondary | ICD-10-CM | POA: Insufficient documentation

## 2015-06-28 DIAGNOSIS — M7120 Synovial cyst of popliteal space [Baker], unspecified knee: Secondary | ICD-10-CM | POA: Insufficient documentation

## 2015-06-28 NOTE — Progress Notes (Signed)
Pre visit review using our clinic review tool, if applicable. No additional management support is needed unless otherwise documented below in the visit note. 

## 2015-06-28 NOTE — Assessment & Plan Note (Signed)
Patient did have aspiration today and tolerated the procedure very well. We discussed icing regimen, home exercises, which activities doing which was potentially avoid. We discussed the possibility of compression given topical anti-inflammatories. Patient try to make these changes and come back and see me again in 3 weeks to make sure that there is no reaccumulation.

## 2015-06-28 NOTE — Progress Notes (Signed)
[facility] [address], [place] [address] Phone: [phone] Subjective:    I'm seeing this patient by the request  of:  BAITY, REGINA, NP   CC: right knee pain  [id number] [name] [name] is a 59 y.o. male coming in with complaint of right knee pain. Patient states approximate one month ago he noticed that he was working with with his daughter doing some housework and the next day woke up and had a significant swollen knee on the right side. Patient states over the course of time the swelling has improved but sometimes has a fullness feeling. Seems to be on the medial and posterior aspects of the knee. It is more of a discomfort than true pain. Patient states though that is no longer going away. Patient did stop doing exercises but when he started to did not get any worse. Denies any radiation down the legs or any numbness or tingling. States that sometimes it can be a popping sensation can be somewhat uncomfortable would not cause of pain though.    Past Medical History  Diagnosis Date  . 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 DIVERTICULOSIS, COLON 11/03/2007  . LOW BACK PAIN, CHRONIC 12/18/2008  . CARCINOMA, BASAL CELL, RECURRENT 09/07/2008  . Basal cell carcinoma of face     5 of the face   Past Surgical History  Procedure Laterality Date  . Tonsillectomy and adenoidectomy      age 34  . Inguinal hernia repair      left at age 28 (concomittently with exploratory surgery)  . Exploration for undescended testicle       age 24  . Fractured wrist      at 11  . Fractured patella      teenager  . Colonoscopy  2006, 2016    negative screenings   Social History  Substance Use Topics  . Smoking status: Former Smoker    Quit date: 04/09/1977  . Smokeless tobacco: Never Used  . Alcohol Use: 7.2 oz/week    12 Standard drinks or equivalent per week     Comment: moderate--2 beers daily   Allergies  Allergen Reactions  .  Amoxicillin-Pot Clavulanate     REACTION: casues upset stomach  . Amoxicillin-Pot Clavulanate Itching   Family History  Problem Relation Age of Onset  . Hyperlipidemia Mother   . Hypertension Father   . Atrial fibrillation Father   . Obesity Father   . Heart disease Father     a. fib  . Cancer Maternal Grandmother     Uterine  . Hyperlipidemia Other   . Diabetes Other   . Cancer Paternal Grandmother     Breast  . Stroke Paternal Grandmother   . Colon cancer Neg Hx   . Colon polyps Neg Hx     Past medical history, social, surgical and family history all reviewed in electronic medical record.   Review of Systems: No headache, visual changes, nausea, vomiting, diarrhea, constipation, dizziness, abdominal pain, skin rash, fevers, chills, night sweats, weight loss, swollen lymph nodes, body aches, joint swelling, muscle aches, chest pain, shortness of breath, mood changes.   Objective Blood pressure 128/70, pulse 63, height 5\' 8"  (1.727 m), weight 192 lb (87.091 kg), SpO2 97 %.  General: No apparent distress alert and oriented x3 mood and affect normal, dressed appropriately.  HEENT: Pupils equal, extraocular movements intact  Respiratory: Patient's speak in full sentences and does not appear short of breath  Cardiovascular: No lower extremity edema, non tender, no erythema  Skin: Warm dry intact with no signs of infection or rash on extremities or on axial skeleton.  Abdomen: Soft nontender  Neuro: Cranial nerves II through XII are intact, neurovascularly intact in all extremities with 2+ DTRs and 2+ pulses.  Lymph: No lymphadenopathy of posterior or anterior cervical chain or axillae bilaterally.  Gait normal with good balance and coordination.  MSK:  Non tender with full range of motion and good stability and symmetric strength and tone of shoulders, elbows, wrist, hip, and ankles bilaterally.  Knee: Right Normal to inspection with no erythema or effusion or obvious bony  abnormalities.patient does have fullness of the popliteal fossa Palpation normal with no warmth, joint line tenderness, patellar tenderness, or condyle tenderness. ROM full in flexion and extension and lower leg rotation. Ligaments with solid consistent endpoints including ACL, PCL, LCL, MCL. Negative Mcmurray's, Apley's, and Thessalonian tests. Non painful patellar compression. Patellar glide without crepitus. Patellar and quadriceps tendons unremarkable. Hamstring and quadriceps strength is normal.   MSK US performed of: Right knee This study was ordered, performed, and interpreted by Charlann Boxer D.O.  Knee: All structures visualized. Mild narrowing of the medial joint space. Patient does have some chronic changes of the MCL. Patellar Tendon unremarkable on long and transverse views without effusion. No abnormality of prepatellar bursa. .  IMPRESSION:  Baker cyst chronic MCL changes  Procedure: Real-time Ultrasound Guided Injection of right knee Device: GE Logiq E  Ultrasound guided injection is preferred based studies that show increased duration, increased effect, greater accuracy, decreased procedural pain, increased response rate, and decreased cost with ultrasound guided versus blind injection.  Verbal informed consent obtained.  Time-out conducted.  Noted no overlying erythema, induration, or other signs of local infection.  Skin prepped in a sterile fashion.  Local anesthesia: Topical Ethyl chloride.  With sterile technique and under real time ultrasound guidance: With a 22-gauge 2 inch needle patient was injected with 4 cc of 0.5% Marcaine then aspirated 15 mL of strawlike fluid the Baker cyst then injected 1 cc of Kenalog 40 mg/dL. This was from a posterior medial approach Completed without difficulty  Pain immediately resolved suggesting accurate placement of the medication.  Advised to call if fevers/chills, erythema, induration, drainage, or persistent bleeding.  Images  permanently stored and available for review in the ultrasound unit.  Impression: Technically successful ultrasound guided aspiration of Baker cyst     Impression and Recommendations:     This case required medical decision making of moderate complexity.

## 2015-06-28 NOTE — Telephone Encounter (Signed)
Responded to pt via mychart

## 2015-06-28 NOTE — Patient Instructions (Addendum)
Good to see you.  Ice 20 minutes 2 times daily. Usually after activity and before bed. Exercises 3 times a week.  pennsaid pinkie amount topically 2 times daily as needed.  Vitamin D 2000 Iu daily See me again after [place]!

## 2015-06-28 NOTE — Telephone Encounter (Signed)
Pt call back want to know where can he get this knee band at, please contact him through my chart.

## 2015-07-30 DIAGNOSIS — C44310 Basal cell carcinoma of skin of unspecified parts of face: Secondary | ICD-10-CM | POA: Insufficient documentation

## 2015-07-30 HISTORY — PX: BASAL CELL CARCINOMA EXCISION: SHX1214

## 2015-11-12 ENCOUNTER — Encounter: Payer: Self-pay | Admitting: Internal Medicine

## 2015-12-12 ENCOUNTER — Other Ambulatory Visit: Payer: Self-pay | Admitting: Internal Medicine

## 2015-12-12 NOTE — Telephone Encounter (Signed)
Please prepare lab orders and I will fax as instructed

## 2015-12-12 NOTE — Telephone Encounter (Signed)
Lab orders given to MYD

## 2015-12-12 NOTE — Telephone Encounter (Signed)
Rx lab order faxed to [facility] per pt request--labs ordered CBC, CMP, Lipid, PSA, A1C, Hep C and HIV

## 2015-12-12 NOTE — Telephone Encounter (Signed)
Scheduled pt for CPE on 12/24/15 at pt request, states they got new insurance and he does not need to wait  For "year and a day".  Pt would like to get shingles vaccine that day as well.   Pt is requesting that PCP fax lab orders for his CPE to the [facility] at [phone] (fax#) so he can have the lab draw completed prior to 12/24/15.  Best number to call pt if needed is [phone]

## 2015-12-17 ENCOUNTER — Other Ambulatory Visit: Payer: Self-pay

## 2015-12-17 DIAGNOSIS — Z0000 Encounter for general adult medical examination without abnormal findings: Secondary | ICD-10-CM

## 2015-12-17 NOTE — Progress Notes (Signed)
Patient came in to have blood drawn per [facility], NP-C orders.  Blood was drawn from the right arm without any complication.

## 2015-12-18 LAB — CMP12+LP+TP+TSH+6AC+PSA+CBC…
ALBUMIN: 4.6 g/dL (ref 3.6–4.8)
ALT: 10 IU/L (ref 0–44)
AST: 22 IU/L (ref 0–40)
Albumin/Globulin Ratio: 2.2 (ref 1.2–2.2)
Alkaline Phosphatase: 50 IU/L (ref 39–117)
BASOS ABS: 0 10*3/uL (ref 0.0–0.2)
BILIRUBIN TOTAL: 0.6 mg/dL (ref 0.0–1.2)
BUN/Creatinine Ratio: 16 (ref 10–24)
BUN: 14 mg/dL (ref 8–27)
Basos: 0 %
CALCIUM: 9.3 mg/dL (ref 8.6–10.2)
CHLORIDE: 99 mmol/L (ref 96–106)
CHOLESTEROL TOTAL: 213 mg/dL — AB (ref 100–199)
Chol/HDL Ratio: 2.6 ratio units (ref 0.0–5.0)
Creatinine, Ser: 0.9 mg/dL (ref 0.76–1.27)
EOS (ABSOLUTE): 0.1 10*3/uL (ref 0.0–0.4)
Eos: 2 %
Estimated CHD Risk: <0.5 times avg. (ref 0.0–1.0)
FREE THYROXINE INDEX: 2 (ref 1.2–4.9)
GFR calc non Af Amer: 92 mL/min/{1.73_m2} (ref >59)
GFR, EST AFRICAN AMERICAN: 106 mL/min/{1.73_m2} (ref >59)
GGT: 15 IU/L (ref 0–65)
GLUCOSE: 97 mg/dL (ref 65–99)
Globulin, Total: 2.1 g/dL (ref 1.5–4.5)
HDL: 82 mg/dL (ref >39)
Hematocrit: 42.6 % (ref 37.5–51.0)
Hemoglobin: 14.5 g/dL (ref 12.6–17.7)
IMMATURE GRANS (ABS): 0 10*3/uL (ref 0.0–0.1)
IRON: 85 ug/dL (ref 38–169)
Immature Granulocytes: 0 %
LDH: 175 IU/L (ref 121–224)
LDL CALC: 116 mg/dL — AB (ref 0–99)
LYMPHS: 45 %
Lymphocytes Absolute: 1.4 10*3/uL (ref 0.7–3.1)
MCH: 32 pg (ref 26.6–33.0)
MCHC: 34 g/dL (ref 31.5–35.7)
MCV: 94 fL (ref 79–97)
MONOCYTES: 9 %
Monocytes Absolute: 0.3 10*3/uL (ref 0.1–0.9)
NEUTROS PCT: 44 %
Neutrophils Absolute: 1.3 10*3/uL — ABNORMAL LOW (ref 1.4–7.0)
PLATELETS: 171 10*3/uL (ref 150–379)
Phosphorus: 3 mg/dL (ref 2.5–4.5)
Potassium: 5 mmol/L (ref 3.5–5.2)
Prostate Specific Ag, Serum: 0.5 ng/mL (ref 0.0–4.0)
RBC: 4.53 x10E6/uL (ref 4.14–5.80)
RDW: 13.1 % (ref 12.3–15.4)
Sodium: 142 mmol/L (ref 134–144)
T3 UPTAKE RATIO: 33 % (ref 24–39)
T4, Total: 6.1 ug/dL (ref 4.5–12.0)
TOTAL PROTEIN: 6.7 g/dL (ref 6.0–8.5)
TSH: 1.8 u[IU]/mL (ref 0.450–4.500)
Triglycerides: 75 mg/dL (ref 0–149)
URIC ACID: 6.8 mg/dL (ref 3.7–8.6)
VLDL CHOLESTEROL CAL: 15 mg/dL (ref 5–40)
WBC: 3.1 10*3/uL — AB (ref 3.4–10.8)

## 2015-12-18 LAB — HCV COMMENT:

## 2015-12-18 LAB — HEPATITIS C ANTIBODY (REFLEX): HCV Ab: <0.1 s/co ratio (ref 0.0–0.9)

## 2015-12-18 LAB — HGB A1C W/O EAG: HEMOGLOBIN A1C: 5.6 % (ref 4.8–5.6)

## 2015-12-18 LAB — HIV ANTIBODY (ROUTINE TESTING W REFLEX): HIV Screen 4th Generation wRfx: NONREACTIVE

## 2015-12-24 ENCOUNTER — Encounter: Payer: Self-pay | Admitting: Internal Medicine

## 2015-12-24 ENCOUNTER — Ambulatory Visit (INDEPENDENT_AMBULATORY_CARE_PROVIDER_SITE_OTHER): Payer: Managed Care, Other (non HMO) | Admitting: Internal Medicine

## 2015-12-24 VITALS — BP 120/70 | HR 50 | Temp 98.0°F | Ht 68.0 in | Wt 190.5 lb

## 2015-12-24 DIAGNOSIS — Z23 Encounter for immunization: Secondary | ICD-10-CM | POA: Diagnosis not present

## 2015-12-24 DIAGNOSIS — Z0000 Encounter for general adult medical examination without abnormal findings: Secondary | ICD-10-CM | POA: Diagnosis not present

## 2015-12-24 NOTE — Addendum Note (Signed)
Addended by: Lurlean Nanny on: 12/24/2015 05:15 PM   Modules accepted: Orders

## 2015-12-24 NOTE — Progress Notes (Signed)
Pre visit review using our clinic review tool, if applicable. No additional management support is needed unless otherwise documented below in the visit note. 

## 2015-12-24 NOTE — Progress Notes (Signed)
Subjective:    Patient ID: [name] [name], male    DOB: 06-Dec-1955, 60 y.o.   MRN: [id number]  HPI  Pt presents to the clinic today for his annual exam.  Flu: 04/2015 Tetanus: 2008 Zostovax: never PSA Screening: 12/2015 Colon Screening: 03/2015 Vision Screening: 04/2015, yearly Dentist: biannually  Diet: He eats bananas every other day. He does consume veggies daily. He tries to avoid fried foods. He drinks mostly water. Exercise: He walks daily.  Review of Systems      Past Medical History  Diagnosis Date  . 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 DIVERTICULOSIS, COLON 11/03/2007  . LOW BACK PAIN, CHRONIC 12/18/2008  . CARCINOMA, BASAL CELL, RECURRENT 09/07/2008  . Basal cell carcinoma of face     5 of the face    Current Outpatient Prescriptions  Medication Sig Dispense Refill  . fluticasone (FLONASE) 50 MCG/ACT nasal spray Place 2 sprays into both nostrils as needed.      No current facility-administered medications for this visit.    Allergies  Allergen Reactions  . Amoxicillin-Pot Clavulanate     REACTION: casues upset stomach  . Amoxicillin-Pot Clavulanate Itching    Family History  Problem Relation Age of Onset  . Hyperlipidemia Mother   . Hypertension Father   . Atrial fibrillation Father   . Obesity Father   . Heart disease Father     a. fib  . Cancer Maternal Grandmother     Uterine  . Hyperlipidemia Other   . Diabetes Other   . Cancer Paternal Grandmother     Breast  . Stroke Paternal Grandmother   . Colon cancer Neg Hx   . Colon polyps Neg Hx     Social History   Social History  . Marital Status: Single    Spouse Name: N/A  . Number of Children: N/A  . Years of Education: N/A   Occupational History  . [employer]    Social History Main Topics  . Smoking status: Former Smoker    Quit date: 04/09/1977  . Smokeless tobacco: Never Used  . Alcohol Use: 7.2 oz/week    12 Standard drinks or  equivalent per week     Comment: moderate--2 beers daily  . Drug Use: No  . Sexual Activity:    Partners: Female   Other Topics Concern  . Not on file   Social History Narrative   [place]; MBA [employer]. Married '79. 1 daughter -'68 (nurse), 1 son '88. [employer]. 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  HEENT: Denies eye pain, eye redness, ear pain, ringing in the ears, wax buildup, runny nose, nasal congestion, bloody nose, or sore throat. Respiratory: Denies difficulty breathing, shortness of breath, cough or sputum production.   Cardiovascular: Denies chest pain, chest tightness, palpitations or swelling in the hands or feet.  Gastrointestinal: Denies abdominal pain, bloating, constipation, diarrhea or blood in the stool.  GU: Denies urgency, frequency, pain with urination, burning sensation, blood in urine, odor or discharge. Musculoskeletal: Denies decrease in range of motion, difficulty with gait, muscle pain, joint pain or swelling.  Skin: Denies redness, rashes, lesion or ulcercations.  Neurological: Denies dizziness, difficulty with memory, difficulty with speech or problems with balance and coordination.  Psych: Denies anxiety, depression, SI/HI.  No other  specific complaints in a complete review of systems (except as listed in HPI above).   Objective:   Physical Exam   BP 120/70 mmHg  Pulse 50  Temp(Src) 98 F (36.7 C) (Oral)  Ht 5\' 8"  (1.727 m)  Wt 190 lb 8 oz (86.41 kg)  BMI 28.97 kg/m2  SpO2 98% Wt Readings from Last 3 Encounters:  12/24/15 190 lb 8 oz (86.41 kg)  06/28/15 192 lb (87.091 kg)  04/24/15 189 lb (85.73 kg)    General: Appears his stated age, well developed, well nourished in NAD. Skin: Warm, dry and intact.  HEENT: Head: normal shape and size; Eyes:  sclera white, no icterus, conjunctiva pink, PERRLA and EOMs intact; Ears: Tm's gray and intact, normal light reflex; Throat/Mouth: Teeth present, mucosa pink and moist, no exudate, lesions or ulcerations noted.  Neck:  Neck supple, trachea midline. No masses, lumps or thyromegaly present.  Cardiovascular: Normal rate and rhythm. S1,S2 noted.  No murmur, rubs or gallops noted. No JVD or BLE edema. No carotid bruits noted. Pulmonary/Chest: Normal effort and positive vesicular breath sounds. No respiratory distress. No wheezes, rales or ronchi noted.  Abdomen: Soft and nontender. Normal bowel sounds. No distention or masses noted. Liver, spleen and kidneys non palpable. Rectal: Normal rectal tone. Prostate normal in size. No nodules noted. Musculoskeletal: Strength 5/5 BUE/BLE. No signs of joint swelling. No difficulty with gait.  Neurological: Alert and oriented. Cranial nerves II-XII grossly intact. Coordination normal.  Psychiatric: Mood and affect normal. Behavior is normal. Judgment and thought content normal.     BMET    Component Value Date/Time   NA 142 12/17/2015 0840   NA 141 09/07/2008 1424   K 5.0 12/17/2015 0840   CL 99 12/17/2015 0840   CO2 30 09/07/2008 1424   GLUCOSE 97 12/17/2015 0840   GLUCOSE 93 09/07/2008 1424   BUN 14 12/17/2015 0840   BUN 13 03/26/2011   BUN 12 09/07/2008 1424   CREATININE 0.90 12/17/2015 0840   CALCIUM 9.3 12/17/2015 0840   CALCIUM 9.3 03/26/2011   GFRNONAA 92 12/17/2015 0840   GFRAA 106 12/17/2015 0840    Lipid Panel     Component Value Date/Time   CHOL 213* 12/17/2015 0840   CHOL 207 04/10/2011 1355   TRIG 75 12/17/2015 0840   TRIG 86 04/10/2011 1355   HDL 82 12/17/2015 0840   HDL 77* 04/10/2011 1355   CHOLHDL 2.6 12/17/2015 0840   CHOLHDL 2.7 04/10/2011 1355   LDLCALC 116* 12/17/2015 0840    CBC    Component Value Date/Time   WBC 3.1* 12/17/2015 0840   WBC 3.5 03/26/2011   RBC 4.53 12/17/2015 0840   RBC 4.50 03/26/2011   HGB  14.5 03/26/2011   HCT 42.6 12/17/2015 0840   HCT 42 03/26/2011   PLT 171 12/17/2015 0840   MCV 94 12/17/2015 0840   MCV 94.0 03/26/2011   MCH 32.0 12/17/2015 0840   MCH 32.2 03/26/2011   MCHC 34.0 12/17/2015 0840   RDW 13.1 12/17/2015 0840   RDW 13.5 03/26/2011   LYMPHSABS 1.4 12/17/2015 0840   MONOABS 0 03/26/2011   EOSABS 0.1 12/17/2015 0840   BASOSABS 0.0 12/17/2015 0840    Hgb A1C Lab Results  Component Value Date   HGBA1C 5.6 12/17/2015        Assessment & Plan:   Preventative Health Maintenance:  Flu shot UTD Tetanus UTD, due 2018 Zostovax given today PSA ordered with his screening labs-normal CBC, CMET, TSH and Lipid  Profile done 1 week ago, reviewed Colonoscopy UTD Encouraged him to continue seeing an eye doctor and dentist at least annually Encouraged him to see an eye doctor and dentist at least annually  RTC in 1 year or sooner if needed

## 2015-12-24 NOTE — Patient Instructions (Signed)

## 2016-04-28 ENCOUNTER — Ambulatory Visit: Payer: Self-pay | Admitting: Physician Assistant

## 2016-04-28 ENCOUNTER — Encounter: Payer: Self-pay | Admitting: Physician Assistant

## 2016-04-28 VITALS — BP 151/76 | HR 58 | Temp 98.7°F

## 2016-04-28 DIAGNOSIS — M545 Low back pain, unspecified: Secondary | ICD-10-CM

## 2016-04-28 MED ORDER — MELOXICAM 15 MG PO TABS: 15.0000 mg | ORAL_TABLET | Freq: Every day | ORAL | 0 refills | Status: DC

## 2016-04-28 MED ORDER — DICLOFENAC SODIUM 1 % TD GEL: 4.0000 g | Freq: Four times a day (QID) | TRANSDERMAL | 6 refills | Status: DC

## 2016-04-28 MED ORDER — CYCLOBENZAPRINE HCL 10 MG PO TABS: 10.0000 mg | ORAL_TABLET | Freq: Three times a day (TID) | ORAL | 0 refills | Status: DC | PRN

## 2016-04-28 NOTE — Progress Notes (Signed)
S:  C/o low back pain for 3 days, no known injury,has hx of same in the past, usually goes away after using nsaids and muscle relaxers, pain is worse with movement, increased with bending over and sitting;  denies numbness, tingling, or changes in bowel/urinary habits,  Using otc meds without relief Remainder ros neg  O:  Vitals wnl, nad, lungs c t a, cv rrr, spine nontender, muscles in lower back spasmed , decreased rom with bending forward,  Neg slr, pt walks without difficulty, no foot drop noted, n/v intact  A: acute back pain, muscle spasms  P: use wet heat followed by ice, stretches, return to clinic if not better in 3 t 5 days, return earlier if worsening, rx meds: flexeril, voltaren gel, mobic

## 2016-07-31 ENCOUNTER — Encounter: Payer: Self-pay | Admitting: Internal Medicine

## 2016-08-01 ENCOUNTER — Encounter: Payer: Self-pay | Admitting: Internal Medicine

## 2016-08-01 MED ORDER — CIPROFLOXACIN HCL 500 MG PO TABS: 500.0000 mg | ORAL_TABLET | Freq: Two times a day (BID) | ORAL | 0 refills | Status: DC

## 2016-08-01 MED ORDER — ATOVAQUONE-PROGUANIL HCL 250-100 MG PO TABS: 1.0000 | ORAL_TABLET | Freq: Every day | ORAL | 0 refills | Status: DC

## 2016-08-06 ENCOUNTER — Ambulatory Visit (INDEPENDENT_AMBULATORY_CARE_PROVIDER_SITE_OTHER): Payer: Managed Care, Other (non HMO) | Admitting: *Deleted

## 2016-08-06 DIAGNOSIS — Z23 Encounter for immunization: Secondary | ICD-10-CM | POA: Diagnosis not present

## 2016-09-29 ENCOUNTER — Encounter: Payer: Self-pay | Admitting: Internal Medicine

## 2016-09-30 ENCOUNTER — Encounter: Payer: Self-pay | Admitting: Internal Medicine

## 2016-09-30 ENCOUNTER — Ambulatory Visit (INDEPENDENT_AMBULATORY_CARE_PROVIDER_SITE_OTHER): Payer: Managed Care, Other (non HMO) | Admitting: Internal Medicine

## 2016-09-30 VITALS — BP 122/70 | HR 56 | Temp 98.2°F | Wt 189.0 lb

## 2016-09-30 DIAGNOSIS — K5732 Diverticulitis of large intestine without perforation or abscess without bleeding: Secondary | ICD-10-CM

## 2016-09-30 MED ORDER — METRONIDAZOLE 500 MG PO TABS: 500.0000 mg | ORAL_TABLET | Freq: Three times a day (TID) | ORAL | 0 refills | Status: DC

## 2016-09-30 MED ORDER — MOXIFLOXACIN HCL 400 MG PO TABS: 400.0000 mg | ORAL_TABLET | Freq: Every day | ORAL | 0 refills | Status: DC

## 2016-09-30 MED ORDER — CIPROFLOXACIN HCL 500 MG PO TABS: 500.0000 mg | ORAL_TABLET | Freq: Two times a day (BID) | ORAL | 0 refills | Status: DC

## 2016-09-30 NOTE — Patient Instructions (Signed)
Diverticulitis °Diverticulitis is when small pockets that have formed in your colon (large intestine) become infected or swollen. °Follow these instructions at home: °· Follow your doctor's instructions. °· Follow a special diet if told by your doctor. °· When you feel better, your doctor may tell you to change your diet. You may be told to eat a lot of fiber. Fruits and vegetables are good sources of fiber. Fiber makes it easier to poop (have bowel movements). °· Take supplements or probiotics as told by your doctor. °· Only take medicines as told by your doctor. °· Keep all follow-up visits with your doctor. °Contact a doctor if: °· Your pain does not get better. °· You have a hard time eating food. °· You are not pooping like normal. °Get help right away if: °· Your pain gets worse. °· Your problems do not get better. °· Your problems suddenly get worse. °· You have a fever. °· You keep throwing up (vomiting). °· You have bloody or black, tarry poop (stool). °This information is not intended to replace advice given to you by your health care provider. Make sure you discuss any questions you have with your health care provider. °Document Released: 01/21/2008 Document Revised: 01/10/2016 Document Reviewed: 06/29/2013 °Elsevier Interactive Patient Education © 2017 Elsevier Inc. ° °

## 2016-09-30 NOTE — Progress Notes (Signed)
Subjective:    Patient ID: [name] [name], male    DOB: 05/08/1956, 61 y.o.   MRN: [id number]  HPI  Pt presents to the clinic today with c/o left lower quadrant abdominal pain, bloating and constipation. This started 1 week ago. His bowels are more loose now, but he denies blood in his stool. He denies nausea and vomiting. He dose have a history of diverticulosis, colonoscopy from 03/2015 reviewed. He had a RX for Cipro for traveller's diarrhea leftover, so he started taking that 2 days ago.  Review of Systems      Past Medical History:  Diagnosis Date  . Basal cell carcinoma of face    5 of the face  . CARCINOMA, BASAL CELL, RECURRENT 09/07/2008  . 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 DIVERTICULOSIS, COLON 11/03/2007  . LOW BACK PAIN, CHRONIC 12/18/2008    Current Outpatient Prescriptions  Medication Sig Dispense Refill  . atovaquone-proguanil (MALARONE) 250-100 MG TABS tablet Take 1 tablet by mouth daily. Starting 08/04/16, stopping 08/21/16. 18 tablet 0  . ciprofloxacin (CIPRO) 500 MG tablet Take 1 tablet (500 mg total) by mouth 2 (two) times daily. If needed for traveller's diarrhea 6 tablet 0  . cyclobenzaprine (FLEXERIL) 10 MG tablet Take 1 tablet (10 mg total) by mouth 3 (three) times daily as needed for muscle spasms. 30 tablet 0  . diclofenac sodium (VOLTAREN) 1 % GEL Apply 4 g topically 4 (four) times daily. 100 g 6  . fluticasone (FLONASE) 50 MCG/ACT nasal spray Place 2 sprays into both nostrils as needed.     . meloxicam (MOBIC) 15 MG tablet Take 1 tablet (15 mg total) by mouth daily. 30 tablet 0   No current facility-administered medications for this visit.     Allergies  Allergen Reactions  . Amoxicillin-Pot Clavulanate     REACTION: casues upset stomach  . Amoxicillin-Pot Clavulanate Itching    Family History  Problem Relation Age of Onset  . Hyperlipidemia Mother   . Hypertension Father   . Atrial fibrillation Father   . Obesity Father   .  Heart disease Father     a. fib  . Cancer Maternal Grandmother     Uterine  . Hyperlipidemia Other   . Diabetes Other   . Cancer Paternal Grandmother     Breast  . Stroke Paternal Grandmother   . Colon cancer Neg Hx   . Colon polyps Neg Hx     Social History   Social History  . Marital status: Single    Spouse name: N/A  . Number of children: N/A  . Years of education: N/A   Occupational History  . [employer]    Social History Main Topics  . Smoking status: Former Smoker    Quit date: 04/09/1977  . Smokeless tobacco: Never Used  . Alcohol use 7.2 oz/week    12 Standard drinks or equivalent per week     Comment: moderate--2 beers daily  . Drug use: No  . Sexual activity: Yes    Partners: Female   Other Topics Concern  . Not on file   Social History Narrative   [place]; MBA [employer]. Married '79. 1 daughter -'51 (nurse), 1 son '88. [employer]. 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  Gastrointestinal: Pt  reports abdominal pain, bloating and constipation. Denies diarrhea or blood in the stool.  GU: Denies urgency, frequency, pain with urination, burning sensation, blood in urine, odor or discharge.  No other specific complaints in a complete review of systems (except as listed in HPI above).  Objective:   Physical Exam   BP 122/70   Pulse (!) 56   Temp 98.2 F (36.8 C) (Oral)   Wt 189 lb (85.7 kg)   SpO2 98%   BMI 28.74 kg/m  Wt Readings from Last 3 Encounters:  09/30/16 189 lb (85.7 kg)  12/24/15 190 lb 8 oz (86.4 kg)  06/28/15 192 lb (87.1 kg)    General: Appears his stated age, in NAD. Abdomen: Soft and tender in the LLQ. Hyperactive bowel sounds. No distention or masses noted.  BMET    Component Value Date/Time   NA 142 12/17/2015  0840   K 5.0 12/17/2015 0840   CL 99 12/17/2015 0840   CO2 30 09/07/2008 1424   GLUCOSE 97 12/17/2015 0840   GLUCOSE 93 09/07/2008 1424   BUN 14 12/17/2015 0840   BUN 13 03/26/2011   CREATININE 0.90 12/17/2015 0840   CALCIUM 9.3 12/17/2015 0840   CALCIUM 9.3 03/26/2011   GFRNONAA 92 12/17/2015 0840   GFRAA 106 12/17/2015 0840    Lipid Panel     Component Value Date/Time   CHOL 213 (H) 12/17/2015 0840   CHOL 207 04/10/2011 1355   TRIG 75 12/17/2015 0840   TRIG 86 04/10/2011 1355   HDL 82 12/17/2015 0840   CHOLHDL 2.6 12/17/2015 0840   CHOLHDL 2.7 04/10/2011 1355   LDLCALC 116 (H) 12/17/2015 0840    CBC    Component Value Date/Time   WBC 3.1 (L) 12/17/2015 0840   WBC 3.5 03/26/2011   RBC 4.53 12/17/2015 0840   RBC 4.50 03/26/2011   HGB 14.5 03/26/2011   HCT 42.6 12/17/2015 0840   HCT 42 03/26/2011   PLT 171 12/17/2015 0840   MCV 94 12/17/2015 0840   MCV 94.0 03/26/2011   MCH 32.0 12/17/2015 0840   MCH 32.2 03/26/2011   MCHC 34.0 12/17/2015 0840   RDW 13.1 12/17/2015 0840   RDW 13.5 03/26/2011   LYMPHSABS 1.4 12/17/2015 0840   MONOABS 0 03/26/2011   EOSABS 0.1 12/17/2015 0840   BASOSABS 0.0 12/17/2015 0840    Hgb A1C Lab Results  Component Value Date   HGBA1C 5.6 12/17/2015           Assessment & Plan:   Diverticulitis:  eRx for Moxifloxacin 400 mg daily x 10 days If not effective, eRx for Cipro and Flagyl to take for 10 days while in [place] Slowly advance diet  RTC as needed or if symptoms persist or worsen Webb Silversmith, NP

## 2016-10-30 ENCOUNTER — Encounter: Payer: Self-pay | Admitting: Internal Medicine

## 2016-12-26 ENCOUNTER — Telehealth: Payer: Self-pay | Admitting: Internal Medicine

## 2016-12-26 NOTE — Telephone Encounter (Signed)
Patient called back with fax number [phone].

## 2016-12-26 NOTE — Telephone Encounter (Signed)
Patient scheduled appointment for a physical on 01/13/17.  Patient can have his lab work done at work. Please send lab order to [facility] will call back with fax number.

## 2016-12-29 NOTE — Telephone Encounter (Signed)
We will be glad to

## 2016-12-29 NOTE — Telephone Encounter (Signed)
Can you order CBC, CMET, Lipid, PSA for this patient, he wants it drawn at [facility]

## 2016-12-31 ENCOUNTER — Encounter: Payer: Self-pay | Admitting: Internal Medicine

## 2017-01-01 ENCOUNTER — Other Ambulatory Visit: Payer: Self-pay

## 2017-01-01 DIAGNOSIS — Z299 Encounter for prophylactic measures, unspecified: Secondary | ICD-10-CM

## 2017-01-01 NOTE — Progress Notes (Signed)
Patient came in to have blood drawn for testing per [facility] orders.

## 2017-01-02 LAB — CMP12+LP+TP+TSH+6AC+PSA+CBC…
A/G RATIO: 2.2 (ref 1.2–2.2)
ALBUMIN: 4.8 g/dL (ref 3.6–4.8)
ALK PHOS: 61 IU/L (ref 39–117)
ALT: 8 IU/L (ref 0–44)
AST: 17 IU/L (ref 0–40)
BASOS ABS: 0 10*3/uL (ref 0.0–0.2)
BILIRUBIN TOTAL: 0.7 mg/dL (ref 0.0–1.2)
BUN/Creatinine Ratio: 11 (ref 10–24)
BUN: 11 mg/dL (ref 8–27)
Basos: 0 %
CHOLESTEROL TOTAL: 184 mg/dL (ref 100–199)
Calcium: 9.6 mg/dL (ref 8.6–10.2)
Chloride: 100 mmol/L (ref 96–106)
Chol/HDL Ratio: 2.6 ratio (ref 0.0–5.0)
Creatinine, Ser: 0.97 mg/dL (ref 0.76–1.27)
EOS (ABSOLUTE): 0 10*3/uL (ref 0.0–0.4)
EOS: 1 %
Estimated CHD Risk: <0.5 times avg. (ref 0.0–1.0)
FREE THYROXINE INDEX: 1.5 (ref 1.2–4.9)
GFR calc Af Amer: 96 mL/min/{1.73_m2} (ref >59)
GFR calc non Af Amer: 83 mL/min/{1.73_m2} (ref >59)
GGT: 13 IU/L (ref 0–65)
GLOBULIN, TOTAL: 2.2 g/dL (ref 1.5–4.5)
Glucose: 89 mg/dL (ref 65–99)
HDL: 71 mg/dL (ref >39)
HEMOGLOBIN: 14.1 g/dL (ref 13.0–17.7)
Hematocrit: 40.5 % (ref 37.5–51.0)
Immature Grans (Abs): 0 10*3/uL (ref 0.0–0.1)
Immature Granulocytes: 0 %
Iron: 118 ug/dL (ref 38–169)
LDH: 165 IU/L (ref 121–224)
LDL CALC: 99 mg/dL (ref 0–99)
LYMPHS ABS: 1.4 10*3/uL (ref 0.7–3.1)
LYMPHS: 37 %
MCH: 31.6 pg (ref 26.6–33.0)
MCHC: 34.8 g/dL (ref 31.5–35.7)
MCV: 91 fL (ref 79–97)
MONOS ABS: 0.4 10*3/uL (ref 0.1–0.9)
Monocytes: 11 %
NEUTROS PCT: 51 %
Neutrophils Absolute: 2 10*3/uL (ref 1.4–7.0)
PLATELETS: 176 10*3/uL (ref 150–379)
PROSTATE SPECIFIC AG, SERUM: 0.4 ng/mL (ref 0.0–4.0)
Phosphorus: 3.5 mg/dL (ref 2.5–4.5)
Potassium: 4.7 mmol/L (ref 3.5–5.2)
RBC: 4.46 x10E6/uL (ref 4.14–5.80)
RDW: 14.2 % (ref 12.3–15.4)
Sodium: 142 mmol/L (ref 134–144)
T3 Uptake Ratio: 27 % (ref 24–39)
T4, Total: 5.7 ug/dL (ref 4.5–12.0)
TRIGLYCERIDES: 71 mg/dL (ref 0–149)
TSH: 1.61 u[IU]/mL (ref 0.450–4.500)
Total Protein: 7 g/dL (ref 6.0–8.5)
Uric Acid: 5.5 mg/dL (ref 3.7–8.6)
VLDL Cholesterol Cal: 14 mg/dL (ref 5–40)
WBC: 3.9 10*3/uL (ref 3.4–10.8)

## 2017-01-13 ENCOUNTER — Ambulatory Visit (INDEPENDENT_AMBULATORY_CARE_PROVIDER_SITE_OTHER): Payer: Managed Care, Other (non HMO) | Admitting: Internal Medicine

## 2017-01-13 ENCOUNTER — Encounter: Payer: Self-pay | Admitting: Internal Medicine

## 2017-01-13 VITALS — BP 116/70 | HR 55 | Temp 97.9°F | Ht 67.75 in | Wt 174.2 lb

## 2017-01-13 DIAGNOSIS — Z23 Encounter for immunization: Secondary | ICD-10-CM

## 2017-01-13 DIAGNOSIS — Z0000 Encounter for general adult medical examination without abnormal findings: Secondary | ICD-10-CM

## 2017-01-13 MED ORDER — TYPHOID VACCINE PO CPDR: 1.0000 | DELAYED_RELEASE_CAPSULE | ORAL | 0 refills | Status: DC

## 2017-01-13 MED ORDER — CIPROFLOXACIN HCL 500 MG PO TABS: 500.0000 mg | ORAL_TABLET | Freq: Two times a day (BID) | ORAL | 0 refills | Status: DC

## 2017-01-13 MED ORDER — MOXIFLOXACIN HCL 400 MG PO TABS: 400.0000 mg | ORAL_TABLET | Freq: Every day | ORAL | 0 refills | Status: DC

## 2017-01-13 NOTE — Progress Notes (Signed)
Subjective:    Patient ID: [name] [name], male    DOB: July 09, 1956, 61 y.o.   MRN: [id number]  HPI  Pt presents to the clinic today for her annual exam.  Flu: 05/2016 Tetanus: 07/2016 Zostavax: 12/2015 Shingrix: PSA Screening: 12/2016 Colon Screening: 03/2015 Vision Screening: annually Dentist: biannually  He is going to [place] in June and is requesting prophylactic abx in case he gets diverticulitis. He also needs the typhoid vaccine.  Diet: He does eat meat. He has cut out things with seeds but does consume fruits and veggies. He does not eat fried food. He drinks mostly water. 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  Review of Systems      Past Medical History:  Diagnosis Date  . Basal cell carcinoma of face    5 of the face  . CARCINOMA, BASAL CELL, RECURRENT 09/07/2008  . 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 DIVERTICULOSIS, COLON 11/03/2007  . LOW BACK PAIN, CHRONIC 12/18/2008    Current Outpatient Prescriptions  Medication Sig Dispense Refill  . atovaquone-proguanil (MALARONE) 250-100 MG TABS tablet Take 1 tablet by mouth daily. Starting 08/04/16, stopping 08/21/16. 18 tablet 0  . ciprofloxacin (CIPRO) 500 MG tablet Take 1 tablet (500 mg total) by mouth 2 (two) times daily. 20 tablet 0  . cyclobenzaprine (FLEXERIL) 10 MG tablet Take 1 tablet (10 mg total) by mouth 3 (three) times daily as needed for muscle spasms. 30 tablet 0  . diclofenac sodium (VOLTAREN) 1 % GEL Apply 4 g topically 4 (four) times daily. 100 g 6  . fluticasone (FLONASE) 50 MCG/ACT nasal spray Place 2 sprays into both nostrils as needed.     . meloxicam (MOBIC) 15 MG tablet Take 1 tablet (15 mg total) by mouth daily. 30 tablet 0  . metroNIDAZOLE (FLAGYL) 500 MG tablet Take 1 tablet (500 mg total) by mouth 3 (three) times daily. 30 tablet 0  . moxifloxacin (AVELOX) 400 MG tablet Take 1 tablet (400 mg total) by mouth daily at 8 pm. 10 tablet 0   No current  facility-administered medications for this visit.     Allergies  Allergen Reactions  . Amoxicillin-Pot Clavulanate     REACTION: casues upset stomach    Family History  Problem Relation Age of Onset  . Hyperlipidemia Mother   . Hypertension Father   . Atrial fibrillation Father   . Obesity Father   . Heart disease Father        a. fib  . Cancer Maternal Grandmother        Uterine  . Cancer Paternal Grandmother        Breast  . Stroke Paternal Grandmother   . Hyperlipidemia Other   . Diabetes Other   . Colon cancer Neg Hx   . Colon polyps Neg Hx     Social History   Social History  . Marital status: Single    Spouse name: N/A  . Number of children: N/A  . Years of education: N/A   Occupational History  . [employer]    Social History Main Topics  . Smoking status: Former Smoker    Quit date: 04/09/1977  . Smokeless tobacco: Never Used  . Alcohol use 7.2 oz/week    12 Standard drinks or equivalent per week     Comment: moderate--2 beers daily  . Drug use: No  . Sexual activity: Yes    Partners: Female   Other Topics Concern  .  Not on file   Social History Narrative   [place]; MBA [employer]. Married '79. 1 daughter -'69 (nurse), 1 son '88. [employer]. 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  HEENT: Denies eye pain, eye redness, ear pain, ringing in the ears, wax buildup, runny nose, nasal congestion, bloody nose, or sore throat. Respiratory: Denies difficulty breathing, shortness of breath, cough or sputum production.   Cardiovascular: Denies chest pain, chest tightness, palpitations or swelling in the hands or feet.  Gastrointestinal: Denies abdominal pain, bloating, constipation, diarrhea or blood in the stool.  GU: Denies urgency, frequency,  pain with urination, burning sensation, blood in urine, odor or discharge. Musculoskeletal: Denies decrease in range of motion, difficulty with gait, muscle pain or joint pain and swelling.  Skin: Denies redness, rashes, lesions or ulcercations.  Neurological: Denies dizziness, difficulty with memory, difficulty with speech or problems with balance and coordination.  Psych: Denies anxiety, depression, SI/HI.  No other specific complaints in a complete review of systems (except as listed in HPI above).  Objective:   Physical Exam  BP 116/70   Pulse (!) 55   Temp 97.9 F (36.6 C) (Oral)   Ht 5' 7.75" (1.721 m)   Wt 174 lb 4 oz (79 kg)   SpO2 98%   BMI 26.69 kg/m  Wt Readings from Last 3 Encounters:  01/13/17 174 lb 4 oz (79 kg)  09/30/16 189 lb (85.7 kg)  12/24/15 190 lb 8 oz (86.4 kg)    General: Appears their stated age, well developed, well nourished in NAD. Skin: Warm, dry and intact. No rashes, lesions or ulcerations noted. HEENT: Head: normal shape and size; Eyes: sclera white, no icterus, conjunctiva pink, PERRLA and EOMs intact; Ears: Tm's gray and intact, normal light reflex; Nose: mucosa pink and moist, septum midline; Throat/Mouth: Teeth present, mucosa pink and moist, no exudate, lesions or ulcerations noted.  Neck:  Neck supple, trachea midline. No masses, lumps or thyromegaly present.  Cardiovascular: Normal rate and rhythm. S1,S2 noted.  No murmur, rubs or gallops noted. No JVD or BLE edema. No carotid bruits noted. Pulmonary/Chest: Normal effort and positive vesicular breath sounds. No respiratory distress. No wheezes, rales or ronchi noted.  Abdomen: Soft and nontender. Normal bowel sounds. No distention or masses noted. Liver, spleen and kidneys non palpable. Musculoskeletal: Normal range of motion. No signs of joint swelling. No difficulty with gait.  Neurological: Alert and oriented. Cranial nerves II-XII grossly intact. Coordination normal.  Psychiatric: Mood and  affect normal. Behavior is normal. Judgment and thought content normal.   EKG:  BMET    Component Value Date/Time   NA 142 01/01/2017 1323   K 4.7 01/01/2017 1323   CL 100 01/01/2017 1323   CO2 30 09/07/2008 1424   GLUCOSE 89 01/01/2017 1323   GLUCOSE 93 09/07/2008 1424   BUN 11 01/01/2017 1323   BUN 13 03/26/2011   CREATININE 0.97 01/01/2017 1323   CALCIUM 9.6 01/01/2017 1323   CALCIUM 9.3 03/26/2011   GFRNONAA 83 01/01/2017 1323   GFRAA 96 01/01/2017 1323    Lipid Panel     Component Value Date/Time   CHOL 184 01/01/2017 1323   CHOL 207 04/10/2011 1355   TRIG 71 01/01/2017 1323   TRIG 86 04/10/2011 1355   HDL 71 01/01/2017 1323  CHOLHDL 2.6 01/01/2017 1323   CHOLHDL 2.7 04/10/2011 1355   LDLCALC 99 01/01/2017 1323    CBC    Component Value Date/Time   WBC 3.9 01/01/2017 1323   WBC 3.5 03/26/2011   RBC 4.46 01/01/2017 1323   RBC 4.50 03/26/2011   HGB 14.5 03/26/2011   HCT 40.5 01/01/2017 1323   HCT 42 03/26/2011   PLT 176 01/01/2017 1323   MCV 91 01/01/2017 1323   MCV 94.0 03/26/2011   MCH 31.6 01/01/2017 1323   MCH 32.2 03/26/2011   MCHC 34.8 01/01/2017 1323   RDW 14.2 01/01/2017 1323   RDW 13.5 03/26/2011   LYMPHSABS 1.4 01/01/2017 1323   MONOABS 0 03/26/2011   EOSABS 0.0 01/01/2017 1323   BASOSABS 0.0 01/01/2017 1323    Hgb A1C Lab Results  Component Value Date   HGBA1C 5.6 12/17/2015            Assessment & Plan:   Preventative Health Maintenance:  Encouraged him to get a flu shot in the fall Tetanus and Zostavax UTD Advised him to call insurance about Shingrix PSA screening UTD Colon Screening UTD Encouraged him to consume a balanced diet and exercise regimen Advised him to see an eye doctor and dentist annually Labs from 2 weeks ago reviewed  Need for Inoculation against Typhoid:  eRx for Vivotif  RTC in 1 year, sooner if needed

## 2017-01-13 NOTE — Patient Instructions (Signed)
 Health Maintenance, Male A healthy lifestyle and preventive care is important for your health and wellness. Ask your health care provider about what schedule of regular examinations is right for you. What should I know about weight and diet?  Eat a Healthy Diet  Eat plenty of vegetables, fruits, whole grains, low-fat dairy products, and lean protein.  Do not eat a lot of foods high in solid fats, added sugars, or salt. Maintain a Healthy Weight  Regular exercise can help you achieve or maintain a healthy weight. You should:  Do at least 150 minutes of exercise each week. The exercise should increase your heart rate and make you sweat (moderate-intensity exercise).  Do strength-training exercises at least twice a week. Watch Your Levels of Cholesterol and Blood Lipids  Have your blood tested for lipids and cholesterol every 5 years starting at 62 years of age. If you are at high risk for heart disease, you should start having your blood tested when you are 62 years old. You may need to have your cholesterol levels checked more often if:  Your lipid or cholesterol levels are high.  You are older than 62 years of age.  You are at high risk for heart disease. What should I know about cancer screening? Many types of cancers can be detected early and may often be prevented. Lung Cancer  You should be screened every year for lung cancer if:  You are a current smoker who has smoked for at least 30 years.  You are a former smoker who has quit within the past 15 years.  Talk to your health care provider about your screening options, when you should start screening, and how often you should be screened. Colorectal Cancer  Routine colorectal cancer screening usually begins at 62 years of age and should be repeated every 5-10 years until you are 62 years old. You may need to be screened more often if early forms of precancerous polyps or small growths are found. Your health care provider  may recommend screening at an earlier age if you have risk factors for colon cancer.  Your health care provider may recommend using home test kits to check for hidden blood in the stool.  A small camera at the end of a tube can be used to examine your colon (sigmoidoscopy or colonoscopy). This checks for the earliest forms of colorectal cancer. Prostate and Testicular Cancer  Depending on your age and overall health, your health care provider may do certain tests to screen for prostate and testicular cancer.  Talk to your health care provider about any symptoms or concerns you have about testicular or prostate cancer. Skin Cancer  Check your skin from head to toe regularly.  Tell your health care provider about any new moles or changes in moles, especially if:  There is a change in a mole's size, shape, or color.  You have a mole that is larger than a pencil eraser.  Always use sunscreen. Apply sunscreen liberally and repeat throughout the day.  Protect yourself by wearing long sleeves, pants, a wide-brimmed hat, and sunglasses when outside. What should I know about heart disease, diabetes, and high blood pressure?  If you are 18-39 years of age, have your blood pressure checked every 3-5 years. If you are 40 years of age or older, have your blood pressure checked every year. You should have your blood pressure measured twice-once when you are at a hospital or clinic, and once when you are not at   a hospital or clinic. Record the average of the two measurements. To check your blood pressure when you are not at a hospital or clinic, you can use:  An automated blood pressure machine at a pharmacy.  A home blood pressure monitor.  Talk to your health care provider about your target blood pressure.  If you are between 45-79 years old, ask your health care provider if you should take aspirin to prevent heart disease.  Have regular diabetes screenings by checking your fasting blood sugar  level.  If you are at a normal weight and have a low risk for diabetes, have this test once every three years after the age of 45.  If you are overweight and have a high risk for diabetes, consider being tested at a younger age or more often.  A one-time screening for abdominal aortic aneurysm (AAA) by ultrasound is recommended for men aged 65-75 years who are current or former smokers. What should I know about preventing infection? Hepatitis B  If you have a higher risk for hepatitis B, you should be screened for this virus. Talk with your health care provider to find out if you are at risk for hepatitis B infection. Hepatitis C  Blood testing is recommended for:  Everyone born from 1945 through 1965.  Anyone with known risk factors for hepatitis C. Sexually Transmitted Diseases (STDs)  You should be screened each year for STDs including gonorrhea and chlamydia if:  You are sexually active and are younger than 62 years of age.  You are older than 62 years of age and your health care provider tells you that you are at risk for this type of infection.  Your sexual activity has changed since you were last screened and you are at an increased risk for chlamydia or gonorrhea. Ask your health care provider if you are at risk.  Talk with your health care provider about whether you are at high risk of being infected with HIV. Your health care provider may recommend a prescription medicine to help prevent HIV infection. What else can I do?  Schedule regular health, dental, and eye exams.  Stay current with your vaccines (immunizations).  Do not use any tobacco products, such as cigarettes, chewing tobacco, and e-cigarettes. If you need help quitting, ask your health care provider.  Limit alcohol intake to no more than 2 drinks per day. One drink equals 12 ounces of beer, 5 ounces of wine, or 1 ounces of hard liquor.  Do not use street drugs.  Do not share needles.  Ask your health  care provider for help if you need support or information about quitting drugs.  Tell your health care provider if you often feel depressed.  Tell your health care provider if you have ever been abused or do not feel safe at home. This information is not intended to replace advice given to you by your health care provider. Make sure you discuss any questions you have with your health care provider. Document Released: 01/31/2008 Document Revised: 04/02/2016 Document Reviewed: 05/08/2015 Elsevier Interactive Patient Education  2017 Elsevier Inc.  

## 2017-01-26 ENCOUNTER — Encounter: Payer: Self-pay | Admitting: Internal Medicine

## 2017-01-28 NOTE — Telephone Encounter (Signed)
Called [facility] to complete but was told it had to be sent for clinical review, awaiting response

## 2017-02-05 ENCOUNTER — Telehealth: Payer: Self-pay

## 2017-02-05 NOTE — Telephone Encounter (Signed)
Received fax from [facility] stating that the Typhoid Rx has been approved... Pt notified via mychart

## 2017-04-28 ENCOUNTER — Ambulatory Visit (INDEPENDENT_AMBULATORY_CARE_PROVIDER_SITE_OTHER): Payer: Managed Care, Other (non HMO)

## 2017-04-28 DIAGNOSIS — Z23 Encounter for immunization: Secondary | ICD-10-CM | POA: Diagnosis not present

## 2017-10-05 ENCOUNTER — Encounter: Payer: Self-pay | Admitting: Internal Medicine

## 2017-10-05 MED ORDER — MOXIFLOXACIN HCL 400 MG PO TABS: 400.0000 mg | ORAL_TABLET | Freq: Every day | ORAL | 0 refills | Status: DC

## 2017-10-05 MED ORDER — CIPROFLOXACIN HCL 500 MG PO TABS: 500.0000 mg | ORAL_TABLET | Freq: Two times a day (BID) | ORAL | 0 refills | Status: DC

## 2018-02-05 ENCOUNTER — Telehealth: Payer: Self-pay | Admitting: Internal Medicine

## 2018-02-05 DIAGNOSIS — Z0000 Encounter for general adult medical examination without abnormal findings: Secondary | ICD-10-CM

## 2018-02-05 NOTE — Telephone Encounter (Signed)
Copied from [place] [id number]. Topic: Quick Communication - See Telephone Encounter >> Feb 05, 2018  9:35 AM Bea Graff, NT wrote: CRM for notification. See Telephone encounter for: 02/05/18. Pt states he usually has CPE labs done at the [employer] with [place] with [facility] for free and would like to see if those orders can be faxed over to them?

## 2018-02-08 NOTE — Telephone Encounter (Signed)
Labs ordered per request.

## 2018-02-08 NOTE — Addendum Note (Signed)
Addended by: Jearld Fenton on: 02/08/2018 08:51 AM   Modules accepted: Orders

## 2018-03-30 ENCOUNTER — Other Ambulatory Visit: Payer: Self-pay

## 2018-03-30 DIAGNOSIS — Z0000 Encounter for general adult medical examination without abnormal findings: Secondary | ICD-10-CM

## 2018-03-31 LAB — COMPREHENSIVE METABOLIC PANEL
ALK PHOS: 54 IU/L (ref 39–117)
ALT: 9 IU/L (ref 0–44)
AST: 18 IU/L (ref 0–40)
Albumin/Globulin Ratio: 2.2 (ref 1.2–2.2)
Albumin: 4.7 g/dL (ref 3.6–4.8)
BUN/Creatinine Ratio: 16 (ref 10–24)
BUN: 15 mg/dL (ref 8–27)
Bilirubin Total: 0.7 mg/dL (ref 0.0–1.2)
CHLORIDE: 105 mmol/L (ref 96–106)
CO2: 23 mmol/L (ref 20–29)
CREATININE: 0.93 mg/dL (ref 0.76–1.27)
Calcium: 8.9 mg/dL (ref 8.6–10.2)
GFR calc Af Amer: 101 mL/min/{1.73_m2} (ref >59)
GFR calc non Af Amer: 87 mL/min/{1.73_m2} (ref >59)
Globulin, Total: 2.1 g/dL (ref 1.5–4.5)
Glucose: 93 mg/dL (ref 65–99)
Potassium: 5 mmol/L (ref 3.5–5.2)
Sodium: 144 mmol/L (ref 134–144)
Total Protein: 6.8 g/dL (ref 6.0–8.5)

## 2018-03-31 LAB — CBC WITH DIFFERENTIAL/PLATELET
BASOS ABS: 0 10*3/uL (ref 0.0–0.2)
Basos: 0 %
EOS (ABSOLUTE): 0.1 10*3/uL (ref 0.0–0.4)
Eos: 2 %
Hematocrit: 43.8 % (ref 37.5–51.0)
Hemoglobin: 14.7 g/dL (ref 13.0–17.7)
IMMATURE GRANS (ABS): 0 10*3/uL (ref 0.0–0.1)
IMMATURE GRANULOCYTES: 0 %
LYMPHS: 35 %
Lymphocytes Absolute: 1.7 10*3/uL (ref 0.7–3.1)
MCH: 32.5 pg (ref 26.6–33.0)
MCHC: 33.6 g/dL (ref 31.5–35.7)
MCV: 97 fL (ref 79–97)
Monocytes Absolute: 0.5 10*3/uL (ref 0.1–0.9)
Monocytes: 11 %
NEUTROS PCT: 52 %
Neutrophils Absolute: 2.5 10*3/uL (ref 1.4–7.0)
Platelets: 163 10*3/uL (ref 150–450)
RBC: 4.52 x10E6/uL (ref 4.14–5.80)
RDW: 13.9 % (ref 12.3–15.4)
WBC: 4.7 10*3/uL (ref 3.4–10.8)

## 2018-03-31 LAB — PSA: PROSTATE SPECIFIC AG, SERUM: 0.5 ng/mL (ref 0.0–4.0)

## 2018-03-31 LAB — LIPID PANEL
CHOLESTEROL TOTAL: 211 mg/dL — AB (ref 100–199)
Chol/HDL Ratio: 2.6 ratio (ref 0.0–5.0)
HDL: 80 mg/dL (ref >39)
LDL CALC: 115 mg/dL — AB (ref 0–99)
Triglycerides: 78 mg/dL (ref 0–149)
VLDL Cholesterol Cal: 16 mg/dL (ref 5–40)

## 2018-03-31 LAB — HGB A1C W/O EAG: Hgb A1c MFr Bld: 5.4 % (ref 4.8–5.6)

## 2018-04-02 ENCOUNTER — Encounter: Payer: Self-pay | Admitting: Internal Medicine

## 2018-04-06 ENCOUNTER — Ambulatory Visit (INDEPENDENT_AMBULATORY_CARE_PROVIDER_SITE_OTHER): Payer: Managed Care, Other (non HMO) | Admitting: Internal Medicine

## 2018-04-06 ENCOUNTER — Encounter: Payer: Self-pay | Admitting: Internal Medicine

## 2018-04-06 VITALS — BP 118/70 | HR 52 | Temp 98.0°F | Ht 67.5 in | Wt 183.0 lb

## 2018-04-06 DIAGNOSIS — H9192 Unspecified hearing loss, left ear: Secondary | ICD-10-CM | POA: Diagnosis not present

## 2018-04-06 DIAGNOSIS — M5137 Other intervertebral disc degeneration, lumbosacral region: Secondary | ICD-10-CM | POA: Diagnosis not present

## 2018-04-06 DIAGNOSIS — Z0000 Encounter for general adult medical examination without abnormal findings: Secondary | ICD-10-CM | POA: Diagnosis not present

## 2018-04-06 DIAGNOSIS — H9312 Tinnitus, left ear: Secondary | ICD-10-CM

## 2018-04-06 MED ORDER — MOXIFLOXACIN HCL 400 MG PO TABS: 400.0000 mg | ORAL_TABLET | Freq: Every day | ORAL | 0 refills | Status: DC

## 2018-04-06 NOTE — Progress Notes (Addendum)
Subjective:    Patient ID: [name] [name], male    DOB: 10-25-1955, 62 y.o.   MRN: [id number]  HPI  Pt presents to the clinic today for his annual exam.  DDD, Lumbar: Chronic pain but stable on Meloxciam. He uses Voltaren Gel and Flexeril on an as needed basis. Lumbar xray from 12/2008 reviewed.  Flu: 05/2017 Tetanus: 07/2016 PSA Screening:03/2018 Colon Screening: 03/2015 Vision Screening: annually Dentist: biannually  Diet: He does eat meat. He consumes more fruits than veggies. He tries to avoid fried foods. He drinks mostly water. Exercise: Walking, 3-4 days per week  Review of Systems      Past Medical History:  Diagnosis Date  . Basal cell carcinoma of face    5 of the face  . CARCINOMA, BASAL CELL, RECURRENT 09/07/2008  . 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 DIVERTICULOSIS, COLON 11/03/2007  . LOW BACK PAIN, CHRONIC 12/18/2008    Current Outpatient Medications  Medication Sig Dispense Refill  . cyclobenzaprine (FLEXERIL) 10 MG tablet Take 1 tablet (10 mg total) by mouth 3 (three) times daily as needed for muscle spasms. 30 tablet 0  . diclofenac sodium (VOLTAREN) 1 % GEL Apply 4 g topically 4 (four) times daily. 100 g 6  . fluticasone (FLONASE) 50 MCG/ACT nasal spray Place 2 sprays into both nostrils as needed.     . meloxicam (MOBIC) 15 MG tablet Take 1 tablet (15 mg total) by mouth daily. 30 tablet 0  . atovaquone-proguanil (MALARONE) 250-100 MG TABS tablet Take 1 tablet by mouth daily. Starting 08/04/16, stopping 08/21/16. (Patient not taking: Reported on 04/06/2018) 18 tablet 0   No current facility-administered medications for this visit.     Allergies  Allergen Reactions  . Amoxicillin-Pot Clavulanate     REACTION: casues upset stomach    Family History  Problem Relation Age of Onset  . Hyperlipidemia Mother   . Hypertension Father   . Atrial fibrillation Father   . Obesity Father   . Heart disease Father        a. fib  . Cancer Maternal  Grandmother        Uterine  . Cancer Paternal Grandmother        Breast  . Stroke Paternal Grandmother   . Hyperlipidemia Other   . Diabetes Other   . Colon cancer Neg Hx   . Colon polyps Neg Hx     Social History   Socioeconomic History  . Marital status: Married    Spouse name: Not on file  . Number of children: Not on file  . Years of education: Not on file  . Highest education level: Not on file  Occupational History  . Occupation: Airline pilot [employer]  Social Needs  . Financial resource strain: Not on file  . Food insecurity:    Worry: Not on file    Inability: Not on file  . Transportation needs:    Medical: Not on file    Non-medical: Not on file  Tobacco Use  . Smoking status: Former Smoker    Last attempt to quit: 04/09/1977    Years since quitting: 41.0  . Smokeless tobacco: Never Used  Substance and Sexual Activity  . Alcohol use: Yes    Alcohol/week: 12.0 standard drinks    Types: 12 Standard drinks or equivalent per week    Comment: moderate--2 beers daily  . Drug use: No  . Sexual activity: Yes    Partners: Female  Lifestyle  .  Physical activity:    Days per week: Not on file    Minutes per session: Not on file  . Stress: Not on file  Relationships  . Social connections:    Talks on phone: Not on file    Gets together: Not on file    Attends religious service: Not on file    Active member of club or organization: Not on file    Attends meetings of clubs or organizations: Not on file    Relationship status: Not on file  . Intimate partner violence:    Fear of current or ex partner: Not on file    Emotionally abused: Not on file    Physically abused: Not on file    Forced sexual activity: Not on file  Other Topics Concern  . Not on file  Social History Narrative   [place]; MBA [employer]. Married '79. 1 daughter -'78 (nurse), 1 son '88. [employer]. 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  HEENT: Pt reports ringing, hearing loss left ear. Denies eye pain, eye redness, ear pain, wax buildup, runny nose, nasal congestion, bloody nose, or sore throat. Respiratory: Denies difficulty breathing, shortness of breath, cough or sputum production.   Cardiovascular: Denies chest pain, chest tightness, palpitations or swelling in the hands or feet.  Gastrointestinal: Denies abdominal pain, bloating, constipation, diarrhea or blood in the stool.  GU: Denies urgency, frequency, pain with urination, burning sensation, blood in urine, odor or discharge. Musculoskeletal: Pt reports chronic low back pain, intermittent right knee pain. Denies decrease in range of motion, difficulty with gait, muscle pain or joint swelling.  Skin: Denies redness, rashes, lesions or ulcercations.  Neurological: Denies dizziness, difficulty with memory, difficulty with speech or problems with balance and coordination.  Psych: Denies anxiety, depression, SI/HI.  No other specific complaints in a complete review of systems (except as listed in HPI above).  Objective:   Physical Exam  BP 118/70   Pulse (!) 52   Temp 98 F (36.7 C) (Oral)   Ht 5' 7.5" (1.715 m)   Wt 183 lb (83 kg)   SpO2 98%   BMI 28.24 kg/m   Wt Readings from Last 3 Encounters:  04/06/18 183 lb (83 kg)  01/13/17 174 lb 4 oz (79 kg)  09/30/16 189 lb (85.7 kg)    General: Appears his stated age, well developed, well nourished in NAD. Skin: Warm, dry and intact.  HEENT: Head: normal shape and size; Eyes: sclera white, no icterus, conjunctiva pink, PERRLA and EOMs intact; Ears: Tm's gray and intact, normal light reflex;  Throat/Mouth: Teeth present, mucosa pink and moist, no exudate, lesions or ulcerations noted.  Neck:  Neck supple, trachea midline. No masses, lumps or thyromegaly present.   Cardiovascular: Bradycardic with normal rhythm. S1,S2 noted.  No murmur, rubs or gallops noted. No JVD or BLE edema. No carotid bruits noted. Pulmonary/Chest: Normal effort and positive vesicular breath sounds. No respiratory distress. No wheezes, rales or ronchi noted.  Abdomen: Soft and nontender. Normal bowel sounds. No distention or masses noted. Liver, spleen and kidneys non palpable. Musculoskeletal: Strength 5/5 BUE/BLE. No difficulty with gait.  Neurological: Alert and oriented. Cranial nerves II-XII grossly intact. Coordination normal.  Psychiatric: Mood and affect normal. Behavior is normal. Judgment and thought content normal.  BMET    Component Value Date/Time   NA 144 03/30/2018 0808   K 5.0 03/30/2018 0808   CL 105 03/30/2018 0808   CO2 23 03/30/2018 0808   GLUCOSE 93 03/30/2018 0808   GLUCOSE 93 09/07/2008 1424   BUN 15 03/30/2018 0808   BUN 13 03/26/2011   CREATININE 0.93 03/30/2018 0808   CALCIUM 8.9 03/30/2018 0808   CALCIUM 9.3 03/26/2011   GFRNONAA 87 03/30/2018 0808   GFRAA 101 03/30/2018 0808    Lipid Panel     Component Value Date/Time   CHOL 211 (H) 03/30/2018 0808   CHOL 207 04/10/2011 1355   TRIG 78 03/30/2018 0808   TRIG 86 04/10/2011 1355   HDL 80 03/30/2018 0808   CHOLHDL 2.6 03/30/2018 0808   CHOLHDL 2.7 04/10/2011 1355   LDLCALC 115 (H) 03/30/2018 0808    CBC    Component Value Date/Time   WBC 4.7 03/30/2018 0808   WBC 3.5 03/26/2011   RBC 4.52 03/30/2018 0808   RBC 4.50 03/26/2011   HGB 14.7 03/30/2018 0808   HCT 43.8 03/30/2018 0808   HCT 42 03/26/2011   PLT 163 03/30/2018 0808   MCV 97 03/30/2018 0808   MCV 94.0 03/26/2011   MCH 32.5 03/30/2018 0808   MCH 32.2 03/26/2011   MCHC 33.6 03/30/2018 0808   RDW 13.9 03/30/2018 0808   RDW 13.5 03/26/2011   LYMPHSABS 1.7 03/30/2018 0808   MONOABS 0 03/26/2011   EOSABS 0.1 03/30/2018 0808   BASOSABS 0.0 03/30/2018 0808    Hgb A1C Lab Results  Component Value Date   HGBA1C  5.4 03/30/2018            Assessment & Plan:   Preventative Health Maintenance:  Encouraged him to get a flu shot in the fall Tetanus UTD Colon screening UTD Encouraged him to consume a balanced diet and exercise regimen Advised him to see an eye doctor and dentist annually Labs from 1 week ago reviewed.  Ringing, Hearing Loss in Left Ear:  Referral placed to audiology  Return precautions discussed Webb Silversmith, NP

## 2018-04-06 NOTE — Assessment & Plan Note (Signed)
Continue Meloxicam, Voltaren Gel and Flexeril Encouraged stretching exercise and use of heat

## 2018-04-06 NOTE — Patient Instructions (Signed)

## 2018-04-08 ENCOUNTER — Ambulatory Visit: Payer: Managed Care, Other (non HMO) | Admitting: Family Medicine

## 2018-04-08 ENCOUNTER — Encounter: Payer: Self-pay | Admitting: Family Medicine

## 2018-04-08 ENCOUNTER — Ambulatory Visit: Payer: Self-pay

## 2018-04-08 VITALS — BP 100/70 | HR 53 | Ht 67.5 in | Wt 186.0 lb

## 2018-04-08 DIAGNOSIS — M25561 Pain in right knee: Secondary | ICD-10-CM | POA: Diagnosis not present

## 2018-04-08 DIAGNOSIS — M23300 Other meniscus derangements, unspecified lateral meniscus, right knee: Secondary | ICD-10-CM

## 2018-04-08 DIAGNOSIS — G8929 Other chronic pain: Secondary | ICD-10-CM

## 2018-04-08 MED ORDER — DICLOFENAC SODIUM 2 % TD SOLN: 2.0000 g | Freq: Two times a day (BID) | TRANSDERMAL | 3 refills | Status: DC

## 2018-04-08 NOTE — Progress Notes (Signed)
[name] [address], [place] [address] Phone: [phone] Subjective:     CC: Right knee pain  MBW:[id number]  [name] [name] is a 62 y.o. male coming in with complaint of right knee pain. States that his knee is getting better. A lot of bending causes pain and swelling. States he is now experiencing burning in the lateral knee near the fibular head.   Location- lateral knee Duration-seems to be only with certain activities but then can last weeks Character- Burning Aggravating factors- flexion Reliving factors-rest Therapies tried-ice and discontinuing being active. Severity-5 out of 10     Past Medical History:  Diagnosis Date  . Basal cell carcinoma of face    5 of the face  . CARCINOMA, BASAL CELL, RECURRENT 09/07/2008  . 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 DIVERTICULOSIS, COLON 11/03/2007  . LOW BACK PAIN, CHRONIC 12/18/2008   Past Surgical History:  Procedure Laterality Date  . BASAL CELL CARCINOMA EXCISION  07/30/2015   temporal left side, back of head right side  . COLONOSCOPY  2006, 2016   negative screenings  . Exploration for Undescended testicle      age 62  . fractured patella     teenager  . fractured wrist     at 11  . INGUINAL HERNIA REPAIR     left at age 16 (concomittently with exploratory surgery)  . TONSILLECTOMY AND ADENOIDECTOMY     age 62   Social History   Socioeconomic History  . Marital status: Married    Spouse name: Not on file  . Number of children: Not on file  . Years of education: Not on file  . Highest education level: Not on file  Occupational History  . Occupation: Airline pilot Dept  Social Needs  . Financial resource strain: Not on file  . Food insecurity:    Worry: Not on file    Inability: Not on file  . Transportation needs:    Medical: Not on file    Non-medical: Not on file  Tobacco Use  . Smoking status: Former Smoker    Last attempt  to quit: 04/09/1977    Years since quitting: 41.0  . Smokeless tobacco: Never Used  Substance and Sexual Activity  . Alcohol use: Yes    Alcohol/week: 12.0 standard drinks    Types: 12 Standard drinks or equivalent per week    Comment: moderate--2 beers daily  . Drug use: No  . Sexual activity: Yes    Partners: Female  Lifestyle  . Physical activity:    Days per week: Not on file    Minutes per session: Not on file  . Stress: Not on file  Relationships  . Social connections:    Talks on phone: Not on file    Gets together: Not on file    Attends religious service: Not on file    Active member of club or organization: Not on file    Attends meetings of clubs or organizations: Not on file    Relationship status: Not on file  Other Topics Concern  . Not on file  Social History Narrative   [place]; MBA [employer]. Married '79. 1 daughter -'14 (nurse), 1 son '88. [employer] [place] [employer]. 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 Marriage in good health            Allergies  Allergen Reactions  . Amoxicillin-Pot Clavulanate  REACTION: casues upset stomach   Family History  Problem Relation Age of Onset  . Hyperlipidemia Mother   . Hypertension Father   . Atrial fibrillation Father   . Obesity Father   . Heart disease Father        a. fib  . Cancer Maternal Grandmother        Uterine  . Cancer Paternal Grandmother        Breast  . Stroke Paternal Grandmother   . Hyperlipidemia Other   . Diabetes Other   . Colon cancer Neg Hx   . Colon polyps Neg Hx      Past medical history, social, surgical and family history all reviewed in electronic medical record.  No pertanent information unless stated regarding to the chief complaint.   Review of Systems:Review of systems updated and as accurate as of 04/08/18  No headache, visual changes, nausea, vomiting, diarrhea, constipation, dizziness, abdominal pain, skin rash,  fevers, chills, night sweats, weight loss, swollen lymph nodes, body aches, joint swelling, muscle aches, chest pain, shortness of breath, mood changes.   Objective  Blood pressure 100/70, pulse (!) 53, height 5' 7.5" (1.715 m), weight 186 lb (84.4 kg), SpO2 98 %. Systems examined below as of 04/08/18   General: No apparent distress alert and oriented x3 mood and affect normal, dressed appropriately.  HEENT: Pupils equal, extraocular movements intact  Respiratory: Patient's speak in full sentences and does not appear short of breath  Cardiovascular: No lower extremity edema, non tender, no erythema  Skin: Warm dry intact with no signs of infection or rash on extremities or on axial skeleton.  Abdomen: Soft nontender  Neuro: Cranial nerves II through XII are intact, neurovascularly intact in all extremities with 2+ DTRs and 2+ pulses.  Lymph: No lymphadenopathy of posterior or anterior cervical chain or axillae bilaterally.  Gait normal with good balance and coordination.  MSK:  Non tender with full range of motion and good stability and symmetric strength and tone of shoulders, elbows, wrist, hip, and ankles bilaterally.  Knee: Right Normal to inspection with no erythema or effusion or obvious bony abnormalities. Palpation normal with no warmth, joint line tenderness, patellar tenderness, or condyle tenderness. ROM full in flexion and extension and lower leg rotation. Ligaments with solid consistent endpoints including ACL, PCL, LCL, MCL. Mild positive Mcmurray's, Apley's, and Thessalonian tests. Non painful patellar compression. Patellar glide without crepitus. Patellar and quadriceps tendons unremarkable. Hamstring and quadriceps strength is normal. Contralateral knee unremarkable  MSK US performed of: Right knee This study was ordered, performed, and interpreted by Charlann Boxer D.O.  Knee: All structures visualized. Patient's posterior lateral meniscus does have a degenerative tear  near the midline. Patellar Tendon unremarkable on long and transverse views without effusion. No abnormality of prepatellar bursa. LCL and MCL unremarkable on long and transverse views. No abnormality of origin of medial or lateral head of the gastrocnemius.  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      Note: This dictation was prepared with Dragon dictation along with smaller phrase technology. Any transcriptional errors that result from this process are unintentional.

## 2018-04-08 NOTE — Assessment & Plan Note (Signed)
Per ultrasound it does appear that this is more of a lateral meniscal tear.  Discussed icing regimen, home exercise, which activities to do which wants to avoid.  Increase activity slowly over the course the next several days.  Patient will avoid certain activities and wear bracing when doing any twisting.  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  Follow-up again in 4 weeks

## 2018-04-08 NOTE — Patient Instructions (Addendum)
Good to see you  Have a great trip  Avoid twisting motions.  Ice 20 minutes 2 times daily. Usually after activity and before bed. Try the brace with a lot of activity  Arnica lotion could help  We will call you when we get samples in as well  See me again when you get back to make sure doing well

## 2018-04-11 ENCOUNTER — Encounter: Payer: Self-pay | Admitting: Internal Medicine

## 2018-04-12 MED ORDER — MOXIFLOXACIN HCL 400 MG PO TABS: 400.0000 mg | ORAL_TABLET | Freq: Every day | ORAL | 0 refills | Status: DC

## 2018-07-20 ENCOUNTER — Ambulatory Visit: Payer: Self-pay | Admitting: Emergency Medicine

## 2018-07-20 ENCOUNTER — Other Ambulatory Visit: Payer: Self-pay

## 2018-07-20 VITALS — BP 108/62 | HR 72 | Temp 97.7°F | Resp 12

## 2018-07-20 DIAGNOSIS — H8110 Benign paroxysmal vertigo, unspecified ear: Secondary | ICD-10-CM

## 2018-07-20 MED ORDER — MECLIZINE HCL 25 MG PO TABS: 25.0000 mg | ORAL_TABLET | Freq: Three times a day (TID) | ORAL | 0 refills | Status: DC | PRN

## 2018-07-20 NOTE — Patient Instructions (Signed)
  Please be careful with rapid movements. Please return to clinic if you develop other neurological symptoms of weakness or numbness. Recheck with your primary care provider if not better in 1 week.   Vertigo Vertigo means that you feel like you are moving when you are not. Vertigo can also make you feel like things around you are moving when they are not. This feeling can come and go at any time. Vertigo often goes away on its own. Follow these instructions at home:  Avoid making fast movements.  Avoid driving.  Avoid using heavy machinery.  Avoid doing any task or activity that might cause danger to you or other people if you would have a vertigo attack while you are doing it.  Sit down right away if you feel dizzy or have trouble with your balance.  Take over-the-counter and prescription medicines only as told by your doctor.  Follow instructions from your doctor about which positions or movements you should avoid.  Drink enough fluid to keep your pee (urine) clear or pale yellow.  Keep all follow-up visits as told by your doctor. This is important. Contact a doctor if:  Medicine does not help your vertigo.  You have a fever.  Your problems get worse or you have new symptoms.  Your family or friends see changes in your behavior.  You feel sick to your stomach (nauseous) or you throw up (vomit).  You have a "pins and needles" feeling or you are numb in part of your body. Get help right away if:  You have trouble moving or talking.  You are always dizzy.  You pass out (faint).  You get very bad headaches.  You feel weak or have trouble using your hands, arms, or legs.  You have changes in your hearing.  You have changes in your seeing (vision).  You get a stiff neck.  Bright light starts to bother you. This information is not intended to replace advice given to you by your health care provider. Make sure you discuss any questions you have with your health  care provider. Document Released: 05/13/2008 Document Revised: 01/10/2016 Document Reviewed: 11/27/2014 Elsevier Interactive Patient Education  [employer].

## 2018-07-20 NOTE — Progress Notes (Signed)
Subjective: Patient returned from a trip to [place] which is close to [place] in the [place] 8 days ago.  4 days ago he started having burning discomfort in his nose and felt fit dry in his nasal passages.  In the spring he uses Flonase but has not been on Flonase at the present time.  He is currently on no medications.  He does not feel ill.  He has a mild frontal headache but otherwise feels fine. Objective: Alert and cooperative not in any distress. Extraocular muscle movement normal. No nystagmus noted. TMs dull gray. Throat clear. Chest clear. Heart regular rate and rhythm. Blood pressure 123/77 lying 125/73 standing Pulse 53 lying Pulse 55 standing Assessment. Patient has mild vertiginous type symptoms.  This may be related to his recent travel and some mild eustachian tube dysfunction. Plan: Instructions sheet given regarding vertigo. Trial of meclizine 25 mg 3 times daily. Return to clinic or to the ER if neurological symptoms develop.

## 2018-09-18 DIAGNOSIS — C4401 Basal cell carcinoma of skin of lip: Secondary | ICD-10-CM

## 2018-09-18 HISTORY — DX: Basal cell carcinoma of skin of lip: C44.01

## 2018-10-24 ENCOUNTER — Encounter: Payer: Self-pay | Admitting: Internal Medicine

## 2018-10-28 ENCOUNTER — Ambulatory Visit: Payer: Managed Care, Other (non HMO) | Admitting: Internal Medicine

## 2018-10-28 ENCOUNTER — Other Ambulatory Visit: Payer: Self-pay

## 2018-10-28 ENCOUNTER — Encounter: Payer: Self-pay | Admitting: Internal Medicine

## 2018-10-28 VITALS — BP 108/76 | HR 86 | Temp 97.9°F | Wt 186.0 lb

## 2018-10-28 DIAGNOSIS — R1032 Left lower quadrant pain: Secondary | ICD-10-CM

## 2018-10-28 DIAGNOSIS — K5790 Diverticulosis of intestine, part unspecified, without perforation or abscess without bleeding: Secondary | ICD-10-CM

## 2018-10-28 NOTE — Patient Instructions (Signed)
Diverticulosis    Diverticulosis is a condition that develops when small pouches (diverticula) form in the wall of the large intestine (colon). The colon is where water is absorbed and stool is formed. The pouches form when the inside layer of the colon pushes through weak spots in the outer layers of the colon. You may have a few pouches or many of them.  What are the causes?  The cause of this condition is not known.  What increases the risk?  The following factors may make you more likely to develop this condition:   Being older than age 64. Your risk for this condition increases with age. Diverticulosis is rare among people younger than age 30. By age 80, many people have it.   Eating a low-fiber diet.   Having frequent constipation.   Being overweight.   Not getting enough exercise.   Smoking.   Taking over-the-counter pain medicines, like aspirin and ibuprofen.   Having a family history of diverticulosis.  What are the signs or symptoms?  In most people, there are no symptoms of this condition. If you do have symptoms, they may include:   Bloating.   Cramps in the abdomen.   Constipation or diarrhea.   Pain in the lower left side of the abdomen.  How is this diagnosed?  This condition is most often diagnosed during an exam for other colon problems. Because diverticulosis usually has no symptoms, it often cannot be diagnosed independently. This condition may be diagnosed by:   Using a flexible scope to examine the colon (colonoscopy).   Taking an X-ray of the colon after dye has been put into the colon (barium enema).   Doing a CT scan.  How is this treated?  You may not need treatment for this condition if you have never developed an infection related to diverticulosis. If you have had an infection before, treatment may include:   Eating a high-fiber diet. This may include eating more fruits, vegetables, and grains.   Taking a fiber supplement.   Taking a live bacteria supplement  (probiotic).   Taking medicine to relax your colon.   Taking antibiotic medicines.  Follow these instructions at home:   Drink 6-8 glasses of water or more each day to prevent constipation.   Try not to strain when you have a bowel movement.   If you have had an infection before:  ? Eat more fiber as directed by your health care provider or your diet and nutrition specialist (dietitian).  ? Take a fiber supplement or probiotic, if your health care provider approves.   Take over-the-counter and prescription medicines only as told by your health care provider.   If you were prescribed an antibiotic, take it as told by your health care provider. Do not stop taking the antibiotic even if you start to feel better.   Keep all follow-up visits as told by your health care provider. This is important.  Contact a health care provider if:   You have pain in your abdomen.   You have bloating.   You have cramps.   You have not had a bowel movement in 3 days.  Get help right away if:   Your pain gets worse.   Your bloating becomes very bad.   You have a fever or chills, and your symptoms suddenly get worse.   You vomit.   You have bowel movements that are bloody or black.   You have bleeding from your rectum.  Summary     Diverticulosis is a condition that develops when small pouches (diverticula) form in the wall of the large intestine (colon).   You may have a few pouches or many of them.   This condition is most often diagnosed during an exam for other colon problems.   If you have had an infection related to diverticulosis, treatment may include increasing the fiber in your diet, taking supplements, or taking medicines.  This information is not intended to replace advice given to you by your health care provider. Make sure you discuss any questions you have with your health care provider.  Document Released: 05/01/2004 Document Revised: 06/23/2016 Document Reviewed: 06/23/2016  Elsevier Interactive Patient  Education  2019 Elsevier Inc.

## 2018-10-28 NOTE — Progress Notes (Signed)
Subjective:    Patient ID: [name] [name], male    DOB: 1956/03/26, 63 y.o.   MRN: [id number]  HPI  Pt presents to the clinic today with c/o LLQ abdominal pain. He reports this occurred 6 days ago. He reports the pain was sore and very tender to touch. He has had some gas and bloating. He denies nausea, vomiting, diarrhea or blood in his stools. He denies fever, chills or body aches. His pain has since improved. He had a colonoscopy in 2016, noted diverticulosis at that time. He denies changes in medication or diet.  Review of Systems      Past Medical History:  Diagnosis Date  . Basal cell carcinoma of face    5 of the face  . CARCINOMA, BASAL CELL, RECURRENT 09/07/2008  . 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 DIVERTICULOSIS, COLON 11/03/2007  . LOW BACK PAIN, CHRONIC 12/18/2008    Current Outpatient Medications  Medication Sig Dispense Refill  . meclizine (ANTIVERT) 25 MG tablet Take 1 tablet (25 mg total) by mouth 3 (three) times daily as needed for dizziness. 20 tablet 0   No current facility-administered medications for this visit.     Allergies  Allergen Reactions  . Amoxicillin-Pot Clavulanate     REACTION: casues upset stomach  . Amoxicillin-Pot Clavulanate Itching    Family History  Problem Relation Age of Onset  . Hyperlipidemia Mother   . Hypertension Father   . Atrial fibrillation Father   . Obesity Father   . Heart disease Father        a. fib  . Cancer Maternal Grandmother        Uterine  . Cancer Paternal Grandmother        Breast  . Stroke Paternal Grandmother   . Hyperlipidemia Other   . Diabetes Other   . Colon cancer Neg Hx   . Colon polyps Neg Hx     Social History   Socioeconomic History  . Marital status: Married    Spouse name: Not on file  . Number of children: Not on file  . Years of education: Not on file  . Highest education level: Not on file  Occupational History  . Occupation: Airline pilot  Dept  Social Needs  . Financial resource strain: Not on file  . Food insecurity:    Worry: Not on file    Inability: Not on file  . Transportation needs:    Medical: Not on file    Non-medical: Not on file  Tobacco Use  . Smoking status: Former Smoker    Last attempt to quit: 04/09/1977    Years since quitting: 41.5  . Smokeless tobacco: Never Used  Substance and Sexual Activity  . Alcohol use: Yes    Alcohol/week: 12.0 standard drinks    Types: 12 Standard drinks or equivalent per week    Comment: moderate--2 beers daily  . Drug use: No  . Sexual activity: Yes    Partners: Female  Lifestyle  . Physical activity:    Days per week: Not on file    Minutes per session: Not on file  . Stress: Not on file  Relationships  . Social connections:    Talks on phone: Not on file    Gets together: Not on file    Attends religious service: Not on file    Active member of club or organization: Not on file    Attends meetings of clubs or organizations: Not on file  Relationship status: Not on file  . Intimate partner violence:    Fear of current or ex partner: Not on file    Emotionally abused: Not on file    Physically abused: Not on file    Forced sexual activity: Not on file  Other Topics Concern  . Not on file  Social History Narrative   [place]; MBA [employer]. Married '79. 1 daughter -'78 (nurse), 1 son '88. [place] [employer]. 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  Respiratory: Denies difficulty breathing, shortness of breath, cough or sputum production.   Cardiovascular: Denies chest pain, chest tightness, palpitations or swelling in the hands or feet.  Gastrointestinal: Pt reports abdominal pain, bloating. Denies constipation, diarrhea or blood in the stool.  GU: Denies urgency, frequency,  pain with urination, burning sensation, blood in urine, odor or discharge.  No other specific complaints in a complete review of systems (except as listed in HPI above).  Objective:   Physical Exam  BP 108/76   Pulse 86   Temp 97.9 F (36.6 C) (Oral)   Wt 186 lb (84.4 kg)   SpO2 97%   BMI 28.70 kg/m  Wt Readings from Last 3 Encounters:  10/28/18 186 lb (84.4 kg)  04/08/18 186 lb (84.4 kg)  04/06/18 183 lb (83 kg)    General: Appears their stated age, well developed, well nourished in NAD. Skin: Warm, dry and intact. No rashes, lesions or ulcerations noted. HEENT: Head: normal shape and size; Eyes: sclera white, no icterus, conjunctiva pink, PERRLA and EOMs intact; Ears: Tm's gray and intact, normal light reflex; Nose: mucosa pink and moist, septum midline; Throat/Mouth: Teeth present, mucosa pink and moist, no exudate, lesions or ulcerations noted.  Neck:  Neck supple, trachea midline. No masses, lumps or thyromegaly present.  Cardiovascular: Normal rate and rhythm. S1,S2 noted.  No murmur, rubs or gallops noted. No JVD or BLE edema. No carotid bruits noted. Pulmonary/Chest: Normal effort and positive vesicular breath sounds. No respiratory distress. No wheezes, rales or ronchi noted.  Abdomen: Soft and nontender. Normal bowel sounds. No distention or masses noted. Liver, spleen and kidneys non palpable. Musculoskeletal: Normal range of motion. No signs of joint swelling. No difficulty with gait.  Neurological: Alert and oriented. Cranial nerves II-XII grossly intact. Coordination normal.  Psychiatric: Mood and affect normal. Behavior is normal. Judgment and thought content normal.   EKG:  BMET    Component Value Date/Time   NA 144 03/30/2018 0808   K 5.0 03/30/2018 0808   CL 105 03/30/2018 0808   CO2 23 03/30/2018 0808   GLUCOSE 93 03/30/2018 0808   GLUCOSE 93 09/07/2008 1424   BUN 15 03/30/2018 0808   BUN 13 03/26/2011   CREATININE 0.93 03/30/2018 0808   CALCIUM 8.9  03/30/2018 0808   CALCIUM 9.3 03/26/2011   GFRNONAA 87 03/30/2018 0808   GFRAA 101 03/30/2018 0808    Lipid Panel     Component Value Date/Time   CHOL 211 (H) 03/30/2018 0808   CHOL 207 04/10/2011 1355   TRIG 78 03/30/2018 0808   TRIG 86 04/10/2011 1355   HDL 80 03/30/2018 0808   CHOLHDL 2.6 03/30/2018 0808   CHOLHDL 2.7 04/10/2011 1355   LDLCALC 115 (H) 03/30/2018 0808    CBC    Component Value Date/Time  WBC 4.7 03/30/2018 0808   WBC 3.5 03/26/2011   RBC 4.52 03/30/2018 0808   RBC 4.50 03/26/2011   HGB 14.7 03/30/2018 0808   HCT 43.8 03/30/2018 0808   HCT 42 03/26/2011   PLT 163 03/30/2018 0808   MCV 97 03/30/2018 0808   MCV 94.0 03/26/2011   MCH 32.5 03/30/2018 0808   MCH 32.2 03/26/2011   MCHC 33.6 03/30/2018 0808   RDW 13.9 03/30/2018 0808   RDW 13.5 03/26/2011   LYMPHSABS 1.7 03/30/2018 0808   MONOABS 0 03/26/2011   EOSABS 0.1 03/30/2018 0808   BASOSABS 0.0 03/30/2018 0808    Hgb A1C Lab Results  Component Value Date   HGBA1C 5.4 03/30/2018            Assessment & Plan:

## 2019-01-27 ENCOUNTER — Ambulatory Visit: Payer: Managed Care, Other (non HMO) | Admitting: Internal Medicine

## 2019-02-03 ENCOUNTER — Encounter: Payer: Self-pay | Admitting: Internal Medicine

## 2019-02-03 ENCOUNTER — Ambulatory Visit (INDEPENDENT_AMBULATORY_CARE_PROVIDER_SITE_OTHER): Payer: Managed Care, Other (non HMO) | Admitting: Internal Medicine

## 2019-02-03 ENCOUNTER — Other Ambulatory Visit: Payer: Self-pay

## 2019-02-03 VITALS — Ht 69.0 in | Wt 180.0 lb

## 2019-02-03 DIAGNOSIS — R195 Other fecal abnormalities: Secondary | ICD-10-CM | POA: Diagnosis not present

## 2019-02-03 DIAGNOSIS — K5790 Diverticulosis of intestine, part unspecified, without perforation or abscess without bleeding: Secondary | ICD-10-CM | POA: Diagnosis not present

## 2019-02-03 DIAGNOSIS — R1032 Left lower quadrant pain: Secondary | ICD-10-CM | POA: Diagnosis not present

## 2019-02-03 NOTE — Progress Notes (Signed)
TELEHEALTH ENCOUNTER IN SETTING OF COVID-19 PANDEMIC - REQUESTED BY PATIENT SERVICE PROVIDED BY TELEMEDECINE - TYPE: A/V PATIENT LOCATION: [place]  PATIENT HAS CONSENTED TO TELEHEALTH VISIT PROVIDER LOCATION: OFFICE REFERRING PROVIDER:Baity, Coralie Keens, NP PARTICIPANTS OTHER THAN PATIENT: None TIME SPENT ON CALL: 14 minutes    [name] [name] 62 y.o. April 01, 1956 [id number]  Assessment & Plan:   Encounter Diagnoses  Name Primary?  . Symptomatic diverticulosis versus diverticulitis versus IBS Yes  . Abdominal pain, left lower quadrant-intermittent   . Decreased stool caliber-intermittent     I think he most likely has symptomatic diverticulosis versus IBS.  He could have diverticulitis but we have no objective evidence to say that for sure.  If he gets another severe spell I think it is reasonable for him to get a CT of the abdomen and pelvis, I told him we could try to help with that or could be done through primary care.  That way we would have more objective evidence of what is going on.  Otherwise he seems to manage things well and research is coming out to suggest that we do not need to treat diverticulitis with antibiotics in many circumstances.   Subjective:   Chief Complaint: Left lower quadrant pain  HPI The patient is a 63 year old white man with known diverticulosis from previous colonoscopies last of which was in August 2016 (no polyps), who has episodic spells of left lower quadrant pain and bowel changes.  He will get ribbonlike stools and some mucus production.  Sometimes these will be severe.  In the past he has been treated with Cipro and metronidazole, he did not tolerate those because of side effects, he had gastrointestinal upset with Augmentin in the past as well, and he has been also treated with Avelox.  Most of the time he manages these episodes on his own.  They may last for a few days or less usually not longer.  He will modify his diet and go to soup and  rice.  Does not tend to get fevers and there is no bleeding.  He is wondering what else he might do.  He is careful about restricting fiber during a spell and in general he avoids peanuts.  There is no unintentional weight loss.  I have reviewed the records his most recent labs in December of last year were normal.  He takes CBD oil and an occasional allergy pill he is otherwise very healthy.  He says his mother has irritable bowel syndrome and he wonders if that might be in play.  Wt Readings from Last 3 Encounters:  02/03/19 180 lb (81.6 kg)  10/28/18 186 lb (84.4 kg)  04/08/18 186 lb (84.4 kg)    Allergies  Allergen Reactions  . Amoxicillin-Pot Clavulanate     REACTION: casues upset stomach  . Amoxicillin-Pot Clavulanate Itching   Current Meds  Medication Sig  . AMBULATORY NON FORMULARY MEDICATION 20 mg 2 (two) times daily. CBD oil  . fluticasone (FLONASE) 50 MCG/ACT nasal spray Place into both nostrils as needed.    Past Medical History:  Diagnosis Date  . Basal cell carcinoma of face    5 of the face  . CARCINOMA, BASAL CELL, RECURRENT 09/07/2008  . 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 DIVERTICULOSIS, COLON 11/03/2007  . LOW BACK PAIN, CHRONIC 12/18/2008   Past Surgical History:  Procedure Laterality Date  . BASAL CELL CARCINOMA EXCISION  07/30/2015   temporal left side, back of head right side  .  COLONOSCOPY  2006, 2016   negative screenings  . Exploration for Undescended testicle      age 61  . fractured patella     teenager  . fractured wrist     at 11  . INGUINAL HERNIA REPAIR     left at age 47 (concomittently with exploratory surgery)  . 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 Married '79. 1 daughter -'22 (nurse), 1 son '88. [employer]. 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 Marriage in good health       Some beer drinking, former smoker no drug use   family history includes Atrial fibrillation in his father; Cancer in his maternal grandmother and paternal grandmother; Diabetes in an other family member; Heart disease in his father; Hyperlipidemia in his mother and another family member; Hypertension in his father; Obesity in his father; Stroke in his paternal grandmother.   Review of Systems As per HPI.  Data reviewed include primary care notes labs in the EMR previous colonoscopy including images

## 2019-02-03 NOTE — Patient Instructions (Addendum)
It was good to speak to you and see you over the A/V link today.  I am most suspicious of symptomatic diverticulosis but diverticulitis and irritable bowel syndrome or possible causes of your intermittent episodes of abdominal pain and bowel habit changes.   Should you get another spell of severe pain like you are describing to me, I think it would be useful to have a CT scan of the abdomen and pelvis with contrast performed.  You can contact us about that or contact primary care to set that up.  If you have a severe problem going to the emergency room could be required depending upon that particular situation.   I appreciate the opportunity to care for you. Gatha Mayer, MD, [facility]

## 2019-04-28 ENCOUNTER — Telehealth: Payer: Self-pay | Admitting: Internal Medicine

## 2019-04-28 DIAGNOSIS — Z125 Encounter for screening for malignant neoplasm of prostate: Secondary | ICD-10-CM

## 2019-04-28 DIAGNOSIS — Z0000 Encounter for general adult medical examination without abnormal findings: Secondary | ICD-10-CM

## 2019-04-28 NOTE — Addendum Note (Signed)
Addended by: Jearld Fenton on: 04/28/2019 08:28 PM   Modules accepted: Orders

## 2019-04-28 NOTE — Telephone Encounter (Signed)
Patient scheduled a cpx with Regina on 05/26/19.  Patient said he needs a lab order sent to the [facility].Patient said if the order is put in Epic they can see it.  Patient said if you need any further information,you can contact him on my chart or call him.

## 2019-04-28 NOTE — Telephone Encounter (Signed)
done

## 2019-05-18 ENCOUNTER — Encounter: Payer: Self-pay | Admitting: Adult Health

## 2019-05-23 ENCOUNTER — Other Ambulatory Visit: Payer: Self-pay

## 2019-05-23 ENCOUNTER — Other Ambulatory Visit: Payer: Managed Care, Other (non HMO)

## 2019-05-23 DIAGNOSIS — Z0000 Encounter for general adult medical examination without abnormal findings: Secondary | ICD-10-CM | POA: Diagnosis not present

## 2019-05-23 DIAGNOSIS — Z125 Encounter for screening for malignant neoplasm of prostate: Secondary | ICD-10-CM | POA: Diagnosis not present

## 2019-05-24 LAB — COMPREHENSIVE METABOLIC PANEL
ALT: 7 IU/L (ref 0–44)
AST: 19 IU/L (ref 0–40)
Albumin/Globulin Ratio: 2.1 (ref 1.2–2.2)
Albumin: 4.8 g/dL (ref 3.8–4.8)
Alkaline Phosphatase: 57 IU/L (ref 39–117)
BUN/Creatinine Ratio: 13 (ref 10–24)
BUN: 13 mg/dL (ref 8–27)
Bilirubin Total: 0.7 mg/dL (ref 0.0–1.2)
CO2: 23 mmol/L (ref 20–29)
Calcium: 9.6 mg/dL (ref 8.6–10.2)
Chloride: 102 mmol/L (ref 96–106)
Creatinine, Ser: 0.98 mg/dL (ref 0.76–1.27)
GFR calc Af Amer: 94 mL/min/{1.73_m2} (ref >59)
GFR calc non Af Amer: 81 mL/min/{1.73_m2} (ref >59)
Globulin, Total: 2.3 g/dL (ref 1.5–4.5)
Glucose: 100 mg/dL — ABNORMAL HIGH (ref 65–99)
Potassium: 4.6 mmol/L (ref 3.5–5.2)
Sodium: 139 mmol/L (ref 134–144)
Total Protein: 7.1 g/dL (ref 6.0–8.5)

## 2019-05-24 LAB — LIPID PANEL WITH LDL/HDL RATIO
Cholesterol, Total: 215 mg/dL — ABNORMAL HIGH (ref 100–199)
HDL: 76 mg/dL (ref >39)
LDL Chol Calc (NIH): 125 mg/dL — ABNORMAL HIGH (ref 0–99)
LDL/HDL Ratio: 1.6 ratio (ref 0.0–3.6)
Triglycerides: 78 mg/dL (ref 0–149)
VLDL Cholesterol Cal: 14 mg/dL (ref 5–40)

## 2019-05-24 LAB — CBC WITH DIFFERENTIAL/PLATELET
Basophils Absolute: 0 10*3/uL (ref 0.0–0.2)
Basos: 1 %
EOS (ABSOLUTE): 0 10*3/uL (ref 0.0–0.4)
Eos: 1 %
Hematocrit: 42.5 % (ref 37.5–51.0)
Hemoglobin: 14.4 g/dL (ref 13.0–17.7)
Immature Grans (Abs): 0 10*3/uL (ref 0.0–0.1)
Immature Granulocytes: 0 %
Lymphocytes Absolute: 1.3 10*3/uL (ref 0.7–3.1)
Lymphs: 34 %
MCH: 31.4 pg (ref 26.6–33.0)
MCHC: 33.9 g/dL (ref 31.5–35.7)
MCV: 93 fL (ref 79–97)
Monocytes Absolute: 0.3 10*3/uL (ref 0.1–0.9)
Monocytes: 9 %
Neutrophils Absolute: 2 10*3/uL (ref 1.4–7.0)
Neutrophils: 55 %
Platelets: 177 10*3/uL (ref 150–450)
RBC: 4.58 x10E6/uL (ref 4.14–5.80)
RDW: 12.4 % (ref 11.6–15.4)
WBC: 3.7 10*3/uL (ref 3.4–10.8)

## 2019-05-24 LAB — PSA: Prostate Specific Ag, Serum: 0.4 ng/mL (ref 0.0–4.0)

## 2019-05-26 ENCOUNTER — Other Ambulatory Visit: Payer: Self-pay

## 2019-05-26 ENCOUNTER — Ambulatory Visit (INDEPENDENT_AMBULATORY_CARE_PROVIDER_SITE_OTHER): Payer: Managed Care, Other (non HMO) | Admitting: Internal Medicine

## 2019-05-26 ENCOUNTER — Encounter: Payer: Self-pay | Admitting: Internal Medicine

## 2019-05-26 VITALS — BP 114/66 | HR 55 | Temp 98.9°F | Ht 67.75 in | Wt 190.0 lb

## 2019-05-26 DIAGNOSIS — Z0000 Encounter for general adult medical examination without abnormal findings: Secondary | ICD-10-CM | POA: Diagnosis not present

## 2019-05-26 DIAGNOSIS — M5137 Other intervertebral disc degeneration, lumbosacral region: Secondary | ICD-10-CM | POA: Diagnosis not present

## 2019-05-26 NOTE — Assessment & Plan Note (Signed)
Encouraged regular physical activity Can take Aleve prn

## 2019-05-26 NOTE — Patient Instructions (Signed)

## 2019-05-26 NOTE — Progress Notes (Signed)
Subjective:    Patient ID: [name] [name], male    DOB: 02/10/1956, 63 y.o.   MRN: [id number]  HPI  Pt presents to the clinic today for his annual exam.  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 He does not take anything OTC for this.   Flu: 05/2019 Tetanus: 07/2016 Zostovax: 12/2015 Shingrix: never.  PSA Screening: 05/2019 Colon Screening: 03/2015 Vision Screening: annually Dentist: biannually  Diet: Eats meat and "muscle milk" to help keep a high protein diet. Eats fruit and some vegetables. Drinks coffee, water, milk and beers.  Exercise: Resistance exercise and working in the yard.   Review of Systems      Past Medical History:  Diagnosis Date   Basal cell carcinoma of face    5 of the face   CARCINOMA, BASAL CELL, RECURRENT 09/07/2008   DEGENERATIVE DISC DISEASE, LUMBAR SPINE 09/07/2008   DIVERTICULOSIS, COLON 11/03/2007   LOW BACK PAIN, CHRONIC 12/18/2008    Current Outpatient Medications  Medication Sig Dispense Refill   AMBULATORY NON FORMULARY MEDICATION 20 mg 2 (two) times daily. CBD oil     fluticasone (FLONASE) 50 MCG/ACT nasal spray Place into both nostrils as needed.      No current facility-administered medications for this visit.     Allergies  Allergen Reactions   Amoxicillin-Pot Clavulanate     REACTION: casues upset stomach   Amoxicillin-Pot Clavulanate Itching    Family History  Problem Relation Age of Onset   Hyperlipidemia Mother    Hypertension Father    Atrial fibrillation Father    Obesity Father    Heart disease Father        a. fib   Cancer Maternal Grandmother        Uterine   Cancer Paternal Grandmother        Breast   Stroke Paternal Grandmother    Hyperlipidemia Other    Diabetes Other    Colon cancer Neg Hx    Colon polyps Neg Hx     Social History   Socioeconomic History   Marital status: Married    Spouse name: Not on file   Number of children: Not on file   Years of education: Not on file    Highest education level: Not on file  Occupational History   Occupation: [employer]  Social Needs   Financial resource strain: Not on file   Food insecurity    Worry: Not on file    Inability: Not on file   Transportation needs    Medical: Not on file    Non-medical: Not on file  Tobacco Use   Smoking status: Former Smoker    Quit date: 04/09/1977    Years since quitting: 42.1   Smokeless tobacco: Never Used  Substance and Sexual Activity   Alcohol use: Yes    Alcohol/week: 12.0 standard drinks    Types: 12 Standard drinks or equivalent per week    Comment: moderate--2 beers daily   Drug use: No   Sexual activity: Yes    Partners: Female  Lifestyle   Physical activity    Days per week: Not on file    Minutes per session: Not on file   Stress: Not on file  Relationships   Social connections    Talks on phone: Not on file    Gets together: Not on file    Attends religious service: Not on file    Active member of club or organization: Not on file  Attends meetings of clubs or organizations: Not on file    Relationship status: Not on file   Intimate partner violence    Fear of current or ex partner: Not on file    Emotionally abused: Not on file    Physically abused: Not on file    Forced sexual activity: Not on file  Other Topics Concern   Not on file  Social History Narrative   [place]; MBA [employer]. Married '79. 1 daughter -'64 (nurse), 1 son '88. [employer]. 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 Marriage in good health      Some beer drinking, former smoker no drug use     Constitutional: Denies fever, malaise, fatigue, headache or abrupt weight changes.  HEENT: Denies eye pain, eye redness, ear pain, ringing in the ears, wax buildup, runny nose, nasal congestion, bloody nose, or sore throat. Respiratory: Denies difficulty breathing, shortness of breath,  cough or sputum production.   Cardiovascular: Denies chest pain, chest tightness, palpitations or swelling in the hands or feet.  Gastrointestinal: Pt reports intermittent reflux. Denies abdominal pain, bloating, constipation, diarrhea or blood in the stool.  GU: Denies urgency, frequency, pain with urination, burning sensation, blood in urine, odor or discharge. Musculoskeletal: Pt reports intermittent joint pain. Denies decrease in range of motion, difficulty with gait, muscle pain or joint swelling.  Skin: Denies redness, rashes, lesions or ulcercations.  Neurological: Denies dizziness, difficulty with memory, difficulty with speech or problems with balance and coordination.  Psych: Denies anxiety, depression, SI/HI.  No other specific complaints in a complete review of systems (except as listed in HPI above).  Objective:   Physical Exam   BP 114/66    Pulse (!) 55    Temp 98.9 F (37.2 C) (Temporal)    Ht 5' 7.75" (1.721 m)    Wt 190 lb (86.2 kg)    SpO2 98%    BMI 29.10 kg/m  Wt Readings from Last 3 Encounters:  05/26/19 190 lb (86.2 kg)  02/03/19 180 lb (81.6 kg)  10/28/18 186 lb (84.4 kg)    General: Appears his stated age, well developed, well nourished in NAD. Skin: Warm, dry and intact. No rashes noted. HEENT: Head: normal shape and size; Eyes: sclera white, no icterus, conjunctiva pink, PERRLA and EOMs intact; Ears: Tm's gray and intact, normal light reflex, wearing hearing aids;  Neck:  Neck supple, trachea midline. No masses, lumps or thyromegaly present.  Cardiovascular: Normal rate and rhythm. S1,S2 noted.  No murmur, rubs or gallops noted. No JVD or BLE edema. No carotid bruits noted. Pulmonary/Chest: Normal effort and positive vesicular breath sounds. No respiratory distress. No wheezes, rales or ronchi noted.  Abdomen: Soft and nontender. Normal bowel sounds. No distention or masses noted. Liver, spleen and kidneys non palpable. Musculoskeletal: Strength 5/5 BUE/BLE.  No difficulty with gait.  Neurological: Alert and oriented. Cranial nerves II-XII grossly intact. Coordination normal.  Psychiatric: Mood and affect normal. Behavior is normal. Judgment and thought content normal.     BMET    Component Value Date/Time   NA 139 05/23/2019 1344   K 4.6 05/23/2019 1344   CL 102 05/23/2019 1344   CO2 23 05/23/2019 1344   GLUCOSE 100 (H) 05/23/2019 1344   GLUCOSE 93 09/07/2008 1424   BUN 13 05/23/2019 1344   BUN 13 03/26/2011   CREATININE 0.98 05/23/2019 1344   CALCIUM 9.6 05/23/2019 1344   CALCIUM 9.3 03/26/2011   GFRNONAA 81 05/23/2019  [place] 05/23/2019 1344    Lipid Panel     Component Value Date/Time   CHOL 215 (H) 05/23/2019 1344   CHOL 207 04/10/2011 1355   TRIG 78 05/23/2019 1344   TRIG 86 04/10/2011 1355   HDL 76 05/23/2019 1344   CHOLHDL 2.6 03/30/2018 0808   CHOLHDL 2.7 04/10/2011 1355   LDLCALC 125 (H) 05/23/2019 1344    CBC    Component Value Date/Time   WBC 3.7 05/23/2019 1344   WBC 3.5 03/26/2011   RBC 4.58 05/23/2019 1344   RBC 4.50 03/26/2011   HGB 14.4 05/23/2019 1344   HCT 42.5 05/23/2019 1344   HCT 42 03/26/2011   PLT 177 05/23/2019 1344   MCV 93 05/23/2019 1344   MCV 94.0 03/26/2011   MCH 31.4 05/23/2019 1344   MCH 32.2 03/26/2011   MCHC 33.9 05/23/2019 1344   RDW 12.4 05/23/2019 1344   RDW 13.5 03/26/2011   LYMPHSABS 1.3 05/23/2019 1344   MONOABS 0 03/26/2011   EOSABS 0.0 05/23/2019 1344   BASOSABS 0.0 05/23/2019 1344    Hgb A1C Lab Results  Component Value Date   HGBA1C 5.4 03/30/2018           Assessment & Plan:   Preventative Health Maintenance:  Flu shot UTD Tetanus UTD Zostovax UTD He declines Shingrix Colon screening UTD Encouraged him to consume a balanced diet and exercise regimen  Advised him to see an eye doctor and dentist annually  Labs from 1 week prior reviewed  RTC in 1 year, sooner if needed Webb Silversmith, NP

## 2019-09-29 ENCOUNTER — Ambulatory Visit: Payer: Self-pay

## 2020-01-04 ENCOUNTER — Ambulatory Visit: Payer: Medicare HMO | Attending: Internal Medicine

## 2020-01-04 DIAGNOSIS — Z20822 Contact with and (suspected) exposure to covid-19: Secondary | ICD-10-CM

## 2020-01-05 LAB — NOVEL CORONAVIRUS, NAA: SARS-CoV-2, NAA: NOT DETECTED

## 2020-01-05 LAB — SARS-COV-2, NAA 2 DAY TAT

## 2020-06-18 ENCOUNTER — Encounter: Payer: Self-pay | Admitting: Internal Medicine

## 2020-06-18 DIAGNOSIS — Z125 Encounter for screening for malignant neoplasm of prostate: Secondary | ICD-10-CM

## 2020-06-18 DIAGNOSIS — Z0000 Encounter for general adult medical examination without abnormal findings: Secondary | ICD-10-CM

## 2020-06-19 NOTE — Telephone Encounter (Signed)
Please fax RX for labs as requested.

## 2020-06-26 ENCOUNTER — Telehealth: Payer: Self-pay | Admitting: Internal Medicine

## 2020-06-26 NOTE — Telephone Encounter (Signed)
Pt called in wanted to know about getting his labs done before the phyiscal he normally does it[facility] turn 64 they do not do it there anymore.

## 2020-06-28 ENCOUNTER — Other Ambulatory Visit: Payer: Medicare HMO

## 2020-07-03 ENCOUNTER — Encounter: Payer: Self-pay | Admitting: Internal Medicine

## 2020-07-03 ENCOUNTER — Other Ambulatory Visit: Payer: Self-pay

## 2020-07-03 ENCOUNTER — Ambulatory Visit (INDEPENDENT_AMBULATORY_CARE_PROVIDER_SITE_OTHER): Payer: Medicare HMO | Admitting: Internal Medicine

## 2020-07-03 VITALS — BP 112/68 | HR 66 | Temp 97.0°F | Ht 67.75 in | Wt 186.0 lb

## 2020-07-03 DIAGNOSIS — Z23 Encounter for immunization: Secondary | ICD-10-CM

## 2020-07-03 DIAGNOSIS — Z0000 Encounter for general adult medical examination without abnormal findings: Secondary | ICD-10-CM

## 2020-07-03 DIAGNOSIS — M5137 Other intervertebral disc degeneration, lumbosacral region: Secondary | ICD-10-CM | POA: Diagnosis not present

## 2020-07-03 DIAGNOSIS — Z125 Encounter for screening for malignant neoplasm of prostate: Secondary | ICD-10-CM

## 2020-07-03 LAB — LIPID PANEL
Cholesterol: 204 mg/dL — ABNORMAL HIGH (ref 0–200)
HDL: 72.6 mg/dL (ref >39.00)
LDL Cholesterol: 113 mg/dL — ABNORMAL HIGH (ref 0–99)
NonHDL: 131.59
Total CHOL/HDL Ratio: 3
Triglycerides: 93 mg/dL (ref 0.0–149.0)
VLDL: 18.6 mg/dL (ref 0.0–40.0)

## 2020-07-03 LAB — COMPREHENSIVE METABOLIC PANEL
ALT: 8 U/L (ref 0–53)
AST: 18 U/L (ref 0–37)
Albumin: 4.5 g/dL (ref 3.5–5.2)
Alkaline Phosphatase: 53 U/L (ref 39–117)
BUN: 8 mg/dL (ref 6–23)
CO2: 29 mEq/L (ref 19–32)
Calcium: 9.3 mg/dL (ref 8.4–10.5)
Chloride: 102 mEq/L (ref 96–112)
Creatinine, Ser: 0.94 mg/dL (ref 0.40–1.50)
GFR: 84.87 mL/min (ref >60.00)
Glucose, Bld: 93 mg/dL (ref 70–99)
Potassium: 4.4 mEq/L (ref 3.5–5.1)
Sodium: 139 mEq/L (ref 135–145)
Total Bilirubin: 1 mg/dL (ref 0.2–1.2)
Total Protein: 6.9 g/dL (ref 6.0–8.3)

## 2020-07-03 LAB — CBC
HCT: 42.7 % (ref 39.0–52.0)
Hemoglobin: 14.6 g/dL (ref 13.0–17.0)
MCHC: 34 g/dL (ref 30.0–36.0)
MCV: 95.2 fl (ref 78.0–100.0)
Platelets: 202 10*3/uL (ref 150.0–400.0)
RBC: 4.49 Mil/uL (ref 4.22–5.81)
RDW: 12.8 % (ref 11.5–15.5)
WBC: 4.9 10*3/uL (ref 4.0–10.5)

## 2020-07-03 LAB — PSA, MEDICARE: PSA: 0.44 ng/ml (ref 0.10–4.00)

## 2020-07-03 NOTE — Assessment & Plan Note (Signed)
Stable, no meds Will monitor

## 2020-07-03 NOTE — Patient Instructions (Signed)

## 2020-07-03 NOTE — Progress Notes (Signed)
HPI:  Pt presents to the clinic today for his Welcome to Medicare Exam.  Chronic Back Pain: Secondary to DDD. He does not taken any medication OTC for this.  Past Medical History:  Diagnosis Date  . Basal cell carcinoma (BCC) of upper lip 09/2018  . Basal cell carcinoma of face    5 of the face  . CARCINOMA, BASAL CELL, RECURRENT 09/07/2008  . 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 DIVERTICULOSIS, COLON 11/03/2007  . LOW BACK PAIN, CHRONIC 12/18/2008    Current Outpatient Medications  Medication Sig Dispense Refill  . AMBULATORY NON FORMULARY MEDICATION 20 mg 2 (two) times daily. CBD oil    . fluticasone (FLONASE) 50 MCG/ACT nasal spray Place into both nostrils as needed.      No current facility-administered medications for this visit.    Allergies  Allergen Reactions  . Amoxicillin-Pot Clavulanate     REACTION: casues upset stomach    Family History  Problem Relation Age of Onset  . Hyperlipidemia Mother   . Hypertension Father   . Atrial fibrillation Father   . Obesity Father   . Heart disease Father        a. fib  . Cancer Maternal Grandmother        Uterine  . Cancer Paternal Grandmother        Breast  . Stroke Paternal Grandmother   . Hyperlipidemia Other   . Diabetes Other   . Colon cancer Neg Hx   . Colon polyps Neg Hx     Social History   Socioeconomic History  . Marital status: Married    Spouse name: Not on file  . Number of children: Not on file  . Years of education: Not on file  . Highest education level: Not on file  Occupational History  . Occupation: [employer]  Tobacco Use  . Smoking status: Former Smoker    Quit date: 04/09/1977    Years since quitting: 43.2  . Smokeless tobacco: Never Used  Vaping Use  . Vaping Use: Never used  Substance and Sexual Activity  . Alcohol use: Yes    Alcohol/week: 12.0 standard drinks    Types: 12 Standard drinks or equivalent per week    Comment: moderate--2  beers daily  . Drug use: No  . Sexual activity: Yes    Partners: Female  Other Topics Concern  . Not on file  Social History Narrative   [place]; MBA [employer]. Married '79. 1 daughter -'60 (nurse), 1 son '88. [employer]. 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 Difficulty of Paying Living Expenses: Not on file  Food Insecurity:   . Worried About Charity fundraiser in the Last Year: Not on file  . Ran Out of Food in the Last Year: Not on file  Transportation Needs:   . Lack of Transportation (Medical): Not on file  . Lack of Transportation (Non-Medical): Not on file  Physical Activity:   . Days of Exercise per Week: Not on file  . Minutes of Exercise per Session: Not on file  Stress:   . Feeling of Stress : Not on file  Social Connections:   . Frequency of Communication with Friends and Family: Not on file  . Frequency of Social Gatherings with  Friends and Family: Not on file  . Attends Religious Services: Not on file  . Active Member of Clubs or Organizations: Not on file  . Attends Archivist Meetings: Not on file  . Marital Status: Not on file  Intimate Partner Violence:   . Fear of Current or Ex-Partner: Not on file  . Emotionally Abused: Not on file  . Physically Abused: Not on file  . Sexually Abused: Not on file    Hospitiliaztions: None  Health Maintenance:    Flu: 05/2020  Tetanus: 06/2016  Covid: Moderna  Pneumovax: never  Prevnar: never  Zostavax: 12/2015  Shingrix: never  PSA: 05/2019  Colon Screening: 03/2015  Eye Doctor: annually  Dental Exam: biannually   Providers:   PCP: Webb Silversmith, NP-C  Dermatology: Dr. Manley Mason  Gastroenterology:  Dr. Carlean Purl    I have personally reviewed and have noted:  1. The patient's medical and  social history 2. Their use of alcohol, tobacco or illicit drugs 3. Their current medications and supplements 4. The patient's functional ability including ADL's, fall risks, home safety risks and hearing or visual impairment. 5. Diet and physical activities 6. Evidence for depression or mood disorder  Subjective:   Review of Systems:   Constitutional: Denies fever, malaise, fatigue, headache or abrupt weight changes.  HEENT: Denies eye pain, eye redness, ear pain, ringing in the ears, wax buildup, runny nose, nasal congestion, bloody nose, or sore throat. Respiratory: Denies difficulty breathing, shortness of breath, cough or sputum production.   Cardiovascular: Denies chest pain, chest tightness, palpitations or swelling in the hands or feet.  Gastrointestinal: Pt reports intermittent reflux. Denies abdominal pain, bloating, constipation, diarrhea or blood in the stool.  GU: Denies urgency, frequency, pain with urination, burning sensation, blood in urine, odor or discharge. Musculoskeletal: Pt has a history of chronic back pain. Denies decrease in range of motion, difficulty with gait, muscle pain or joint swelling.  Skin: Denies redness, rashes, lesions or ulcercations.  Neurological: Denies dizziness, difficulty with memory, difficulty with speech or problems with balance and coordination.  Psych: Denies anxiety, depression, SI/HI.  No other specific complaints in a complete review of systems (except as listed in HPI above).  Objective:  PE:   BP 112/68   Pulse 66   Temp (!) 97 F (36.1 C) (Temporal)   Ht 5' 7.75" (1.721 m)   Wt 186 lb (84.4 kg)   SpO2 98%   BMI 28.49 kg/m   Wt Readings from Last 3 Encounters:  05/26/19 190 lb (86.2 kg)  02/03/19 180 lb (81.6 kg)  10/28/18 186 lb (84.4 kg)    General: Appears his stated age, well developed, well nourished in NAD. Skin: Warm, dry and intact. No rashes noted. HEENT: Head: normal shape and size; Eyes: sclera white, no  icterus, conjunctiva pink, PERRLA and EOMs intact; Neck: Neck supple, trachea midline. No masses, lumps or thyromegaly present.  Cardiovascular: Normal rate and rhythm. S1,S2 noted.  No murmur, rubs or gallops noted. No JVD or BLE edema. No carotid bruits noted. Pulmonary/Chest: Normal effort and positive vesicular breath sounds. No respiratory distress. No wheezes, rales or ronchi noted.  Abdomen: Soft and nontender. Normal bowel sounds. No distention or masses noted. Liver, spleen and kidneys non palpable. Musculoskeletal: Strength 5/5 BUE/BLE. No difficulty with gait. Neurological: Alert and oriented. Cranial nerves II-XII grossly intact. Coordination normal.  Psychiatric: Mood and affect normal. Behavior is normal. Judgment and thought content normal.   ECG: Bradycardic, normal rhythm. No acute  findings  BMET    Component Value Date/Time   NA 139 05/23/2019 1344   K 4.6 05/23/2019 1344   CL 102 05/23/2019 1344   CO2 23 05/23/2019 1344   GLUCOSE 100 (H) 05/23/2019 1344   GLUCOSE 93 09/07/2008 1424   BUN 13 05/23/2019 1344   BUN 13 03/26/2011 0000   CREATININE 0.98 05/23/2019 1344   CALCIUM 9.6 05/23/2019 1344   CALCIUM 9.3 03/26/2011 0000   GFRNONAA 81 05/23/2019 1344   GFRAA 94 05/23/2019 1344    Lipid Panel     Component Value Date/Time   CHOL 215 (H) 05/23/2019 1344   CHOL 207 04/10/2011 1355   TRIG 78 05/23/2019 1344   TRIG 86 04/10/2011 1355   HDL 76 05/23/2019 1344   CHOLHDL 2.6 03/30/2018 0808   CHOLHDL 2.7 04/10/2011 1355   LDLCALC 125 (H) 05/23/2019 1344    CBC    Component Value Date/Time   WBC 3.7 05/23/2019 1344   WBC 3.5 03/26/2011 0000   RBC 4.58 05/23/2019 1344   RBC 4.50 03/26/2011 0000   HGB 14.4 05/23/2019 1344   HCT 42.5 05/23/2019 1344   HCT 42 03/26/2011 0000   PLT 177 05/23/2019 1344   MCV 93 05/23/2019 1344   MCV 94.0 03/26/2011 0000   MCH 31.4 05/23/2019 1344   MCH 32.2 03/26/2011 0000   MCHC 33.9 05/23/2019 1344   RDW 12.4  05/23/2019 1344   RDW 13.5 03/26/2011 0000   LYMPHSABS 1.3 05/23/2019 1344   MONOABS 0 03/26/2011 0000   EOSABS 0.0 05/23/2019 1344   BASOSABS 0.0 05/23/2019 1344    Hgb A1C Lab Results  Component Value Date   HGBA1C 5.4 03/30/2018      Assessment and Plan:   Medicare Annual Wellness Visit:  Diet: He does eat meat. He consumes fruits and veggies daily. He rarely eats fried foods. He drinks mostly water, coffee, milk, beer. Physical activity: Walking Depression/mood screen: Negative, PHQ 9 score of 0 Hearing: Wears hearing aides. Visual acuity: Grossly normal, performs annual eye exam  ADLs: Capable Fall risk: None Home safety: Good Cognitive evaluation: Intact to orientation, naming, recall and repetition EOL planning: No adv directives, full code/ I agree  Preventative Medicine: Flu shot UTD. Tetanus UTD. Prevnar today, will get pneumovax in 1 year. Covid UTD. Zostovax UTD. He will consider Shingrix vaccine. Colon screening UTD. Encouraged him to consume a balanced diet and exercise regimen. Advised him to see an eye doctor and dentist annually. Will check CBC, CMET, Lipid, and PSA today. Due dates for screening exams given to patient as part of his AVS.   Next appointment: 1 year, Medicare Wellness Exam   Webb Silversmith, NP This visit occurred during the SARS-CoV-2 public health emergency.  Safety protocols were in place, including screening questions prior to the visit, additional usage of staff PPE, and extensive cleaning of exam room while observing appropriate contact time as indicated for disinfecting solutions.

## 2020-10-02 NOTE — Progress Notes (Signed)
° °  I, [name], LAT, ATC acting as a scribe for Lynne Leader, MD.  [name] [name] is a 65 y.o. male who presents to [place] at [facility] today for R ankle pain ongoing 09/23/20 w/ no known MOI. 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 Pt was last seen by Dr. Tamala Julian on 04/08/18 for chronic R knee pain. Pt locates pain to lateral aspect of ankle along peroneals. Pt notes no pain w/ rest, but increased pain w/ activity.  R ankle swelling: no Mechanical symptoms: no Aggravates: activity Rx tried: ankle compression sleeve, pennsaid  Pertinent review of systems: No fevers or chills  Relevant historical information: Otherwise healthy   Exam:  BP 107/67 (BP Location: Right Arm, Patient Position: Sitting, Cuff Size: Normal)    Pulse 66    Wt 192 lb 9.6 oz (87.4 kg)    SpO2 97%    BMI 29.50 kg/m  General: Well Developed, well nourished, and in no acute distress.   MSK: Right ankle normal-appearing Normal motion. Tender palpation posterior to lateral malleolus. Stable ligamentous exam. Pain with resisted ankle eversion. Pulses capillary refill and sensation are intact distally.    Lab and Radiology Results  Diagnostic Limited MSK Ultrasound of: Right ankle lateral Lateral malleolus normal-appearing.   Lateral ankle joint normal. Peroneal tendons are visible. Hypoechoic fluid tracks within peroneal tendons and at area just posterior to the lateral malleolus. Disruptive C-shaped.  Peroneal longus tendon consistent with partial tear visible.  No full-thickness retracted tear visible. Impression: Peroneal tendinopathy with partial-thickness tear     Assessment and Plan: 65 y.o. male with peroneal tendinitis with partial tear visible.  Plan for home exercise program, physical therapy, nitroglycerin patch protocol, and body illness compression sleeve.  Recheck in 6 weeks.  Return back sooner if needed. Home exercises taught in clinic today by  ATC.  PDMP not reviewed this encounter. Orders Placed This Encounter  Procedures   [place] LIMITED JOINT SPACE STRUCTURES LOW RIGHT(NO LINKED CHARGES)    Standing Status:   Future    Number of Occurrences:   1    Standing Expiration Date:   04/02/2021    Order Specific Question:   Reason for Exam (SYMPTOM  OR DIAGNOSIS REQUIRED)    Answer:   Acute right ankle pain    Order Specific Question:   Preferred imaging location?    Answer:   [place]   Ambulatory referral to Physical Therapy    Referral Priority:   Routine    Referral Type:   Physical Medicine    Referral Reason:   Specialty Services Required    Requested Specialty:   Physical Therapy    Number of Visits Requested:   1   Meds ordered this encounter  Medications   nitroGLYCERIN (NITRODUR - DOSED IN MG/24 HR) 0.2 mg/hr patch    Sig: Apply 1/4 patch daily to tendon for tendonitis.    Dispense:  30 patch    Refill:  1     Discussed warning signs or symptoms. Please see discharge instructions. Patient expresses understanding.   The above documentation has been reviewed and is accurate and complete Lynne Leader, M.D.

## 2020-10-03 ENCOUNTER — Other Ambulatory Visit: Payer: Self-pay

## 2020-10-03 ENCOUNTER — Ambulatory Visit (INDEPENDENT_AMBULATORY_CARE_PROVIDER_SITE_OTHER): Payer: Medicare HMO | Admitting: Family Medicine

## 2020-10-03 ENCOUNTER — Ambulatory Visit: Payer: Self-pay

## 2020-10-03 VITALS — BP 107/67 | HR 66 | Wt 192.6 lb

## 2020-10-03 DIAGNOSIS — M25571 Pain in right ankle and joints of right foot: Secondary | ICD-10-CM

## 2020-10-03 DIAGNOSIS — S86311A Strain of muscle(s) and tendon(s) of peroneal muscle group at lower leg level, right leg, initial encounter: Secondary | ICD-10-CM

## 2020-10-03 DIAGNOSIS — M7671 Peroneal tendinitis, right leg: Secondary | ICD-10-CM

## 2020-10-03 MED ORDER — NITROGLYCERIN 0.2 MG/HR TD PT24: MEDICATED_PATCH | TRANSDERMAL | 1 refills | Status: DC

## 2020-10-03 NOTE — Patient Instructions (Addendum)
Thank you for coming in today.  Plan for PT and home exercises. View at [email or web address] using code: [email or web address]  Nitroglycerin Protocol   Apply 1/4 nitroglycerin patch to affected area daily.  Change position of patch within the affected area every 24 hours.  You may experience a headache during the first 1-2 weeks of using the patch, these should subside.  If you experience headaches after beginning nitroglycerin patch treatment, you may take your preferred over the counter pain reliever.  Another side effect of the nitroglycerin patch is skin irritation or rash related to patch adhesive.  Please notify our office if you develop more severe headaches or rash, and stop the patch.  Tendon healing with nitroglycerin patch may require 12 to 24 weeks depending on the extent of injury.  Men should not use if taking Viagra, Cialis, or Levitra.   Do not use if you have migraines or rosacea.   Recheck in about 6 weeks.   If you are not doing well let me know.    If you get better you do not need to drive down her to tell me you are better.   I recommend you obtained a compression sleeve to help with your joint problems. There are many options on the market however I recommend obtaining a Full ankle Body Helix compression sleeve.  You can find information (including how to appropriate measure yourself for sizing) can be found at www.Body http://www.lambert.com/.  Many of these products are health savings account (HSA) eligible.   You can use the compression sleeve at any time throughout the day but is most important to use while being active as well as for 2 hours post-activity.   It is appropriate to ice following activity with the compression sleeve in place.

## 2020-11-21 NOTE — Progress Notes (Signed)
   I, Wendy Poet, LAT, ATC, am serving as scribe for Dr. Lynne Leader.  [name] [name] is a 64 y.o. male who presents to [place] at [facility] today for f/u acute R ankle pain ongoing since 09/23/20, due to peroneal tendinitis w/ partial tendon tear. Pt was last seen by Dr. Georgina Snell on 10/03/20 and advised to use nitroglycerin patches, compression sleeve, HEP, and was referred to PT but was not able to be seen until April. Today, pt reports that his R ankle is doing better but not as well as he was hoping.  He rates his improvement at 80%.  His biggest aggravating factor is driving.  He states that he has since developed some plantar fasciitis.  PT is treating him for this as well but he does report a "knot" in his mid R plantar fascia.  He states that he isn't using the nitroglycerin patches due to not feeling well while using them.   Pertinent review of systems: No fevers or chills  Relevant historical information: Lumbar DDD   Exam:  BP 112/70 (BP Location: Right Arm, Patient Position: Sitting, Cuff Size: Normal)   Pulse (!) 55   Ht 5' 7.75" (1.721 m)   Wt 187 lb 6.4 oz (85 kg)   SpO2 97%   BMI 28.70 kg/m  General: Well Developed, well nourished, and in no acute distress.   MSK: Right foot small nodule present plantar foot.  Not particularly tender to palpation. Normal foot and ankle motion. Intact strength.    Lab and Radiology Results  Diagnostic Limited MSK Ultrasound of: Right foot plantar nodule Small solid nodule associated with the plantar fascia mid plantar foot measures less than 1 cm.  No vascular activity associated with nodule. Impression: Plantar nodule     Assessment and Plan: 65 y.o. male with right foot plantar fasciitis with plantar nodule.  Plan to continue PT exercises and recommend ice massage and eccentric heel exercises.  Also recommend Voltaren gel.  Recheck in about 6 weeks.  Return sooner if needed.   PDMP not reviewed this  encounter. Orders Placed This Encounter  Procedures  . [place] LIMITED JOINT SPACE STRUCTURES LOW RIGHT(NO LINKED CHARGES)    Order Specific Question:   Reason for Exam (SYMPTOM  OR DIAGNOSIS REQUIRED)    Answer:   R foot pain    Order Specific Question:   Preferred imaging location?    Answer:   [place]   No orders of the defined types were placed in this encounter.    Discussed warning signs or symptoms. Please see discharge instructions. Patient expresses understanding.   The above documentation has been reviewed and is accurate and complete Lynne Leader, M.D.

## 2020-11-22 ENCOUNTER — Encounter: Payer: Self-pay | Admitting: Family Medicine

## 2020-11-22 ENCOUNTER — Ambulatory Visit (INDEPENDENT_AMBULATORY_CARE_PROVIDER_SITE_OTHER): Payer: Medicare HMO | Admitting: Family Medicine

## 2020-11-22 ENCOUNTER — Ambulatory Visit: Payer: Self-pay

## 2020-11-22 ENCOUNTER — Other Ambulatory Visit: Payer: Self-pay

## 2020-11-22 VITALS — BP 112/70 | HR 55 | Ht 67.75 in | Wt 187.4 lb

## 2020-11-22 DIAGNOSIS — M79671 Pain in right foot: Secondary | ICD-10-CM

## 2020-11-22 DIAGNOSIS — M722 Plantar fascial fibromatosis: Secondary | ICD-10-CM | POA: Diagnosis not present

## 2020-11-22 NOTE — Patient Instructions (Signed)
Thank you for coming in today.  Try that pensaid or voltaren   Do the ice massage.   Do the exercises we discussed.   Recheck in 6 weeks if not better.

## 2021-01-08 NOTE — Progress Notes (Signed)
   I, [name], LAT, ATC acting as a scribe for Lynne Leader, MD.  [name] [name] is a 65 y.o. male who presents to [place] at [facility] today for f/u R foot pain ongoing since 09/23/20, due to peroneal tendinitis w/ partial tendon tear and plantar fascitis w/ plantar nodule. Pt was last seen by Dr. Georgina Snell on 11/22/20 and was advised to use ice massage, Voltaren gel, eccentric heel exercises, and cont PT of which he's had no further visits. Today, pt reports foot and ankle ankle are doing a lot better. Pt notes he would have canceled his appt, but he has started to get a tingling sensation is his R upper arm and forearm ongoing for 3 weeks.. Pt had a beach trip last weekend and was loading luggage into the car and carrying it into the beach house. Pt has full AROM in R shoulder.  Neck pain: no Aggravates: driving   Pertinent review of systems: No fevers or chills  Relevant historical information: History of lumbar DDD   Exam:  BP 128/78 (BP Location: Right Arm, Patient Position: Sitting, Cuff Size: Normal)   Pulse 69   Ht 5' 7.75" (1.721 m)   Wt 190 lb 12.8 oz (86.5 kg)   SpO2 97%   BMI 29.23 kg/m  General: Well Developed, well nourished, and in no acute distress.   MSK: C-spine normal-appearing Nontender cervical midline. Normal cervical motion. Positive Spurling's test with left cervical rotation causing right arm symptoms. Upper extremity strength is intact. Reflexes are intact. Sensation is intact throughout.      Assessment and Plan: 65 y.o. male with cervical radiculopathy involving the right arm most likely at the C7 dermatomal pattern.  Fortunately symptoms are very mild at this time mostly involving a intermittent paresthesias.  No significant weakness or severe pain.  At this time plan for a bit of watchful waiting.  Prescribed prednisone and gabapentin to take if symptoms worsen.  If symptoms worsen additionally will start physical therapy  referral.  Patient will let me know.  Recheck as needed.  Foot and ankle pain significantly improved with home exercise program.  Watchful waiting.   PDMP not reviewed this encounter. No orders of the defined types were placed in this encounter.  Meds ordered this encounter  Medications  . predniSONE (DELTASONE) 50 MG tablet    Sig: Take 1 tablet (50 mg total) by mouth daily.    Dispense:  5 tablet    Refill:  0  . gabapentin (NEURONTIN) 300 MG capsule    Sig: Take 1 capsule (300 mg total) by mouth 3 (three) times daily as needed.    Dispense:  90 capsule    Refill:  3     Discussed warning signs or symptoms. Please see discharge instructions. Patient expresses understanding.   The above documentation has been reviewed and is accurate and complete Lynne Leader, M.D.

## 2021-01-09 ENCOUNTER — Ambulatory Visit (INDEPENDENT_AMBULATORY_CARE_PROVIDER_SITE_OTHER): Payer: Medicare HMO | Admitting: Family Medicine

## 2021-01-09 ENCOUNTER — Other Ambulatory Visit: Payer: Self-pay

## 2021-01-09 VITALS — BP 128/78 | HR 69 | Ht 67.75 in | Wt 190.8 lb

## 2021-01-09 DIAGNOSIS — M5412 Radiculopathy, cervical region: Secondary | ICD-10-CM | POA: Insufficient documentation

## 2021-01-09 DIAGNOSIS — M722 Plantar fascial fibromatosis: Secondary | ICD-10-CM | POA: Diagnosis not present

## 2021-01-09 MED ORDER — PREDNISONE 50 MG PO TABS: 50.0000 mg | ORAL_TABLET | Freq: Every day | ORAL | 0 refills | Status: DC

## 2021-01-09 MED ORDER — GABAPENTIN 300 MG PO CAPS: 300.0000 mg | ORAL_CAPSULE | Freq: Three times a day (TID) | ORAL | 3 refills | Status: DC | PRN

## 2021-01-09 NOTE — Patient Instructions (Addendum)
Thank you for coming in today.  I think you have a pinched nerve in your neck (right C7)  Take the prednisone if worse.   Take the gabapentin as needed for nerve pain.   Do PT if not improving in a bit (let me know).   I think this will likely settle down soon.   Recheck as needed.    Cervical Radiculopathy  Cervical radiculopathy happens when a nerve in the neck (a cervical nerve) is pinched or bruised. This condition can happen because of an injury to the cervical spine (vertebrae) in the neck, or as part of the normal aging process. Pressure on the cervical nerves can cause pain or numbness that travels from the neck all the way down into the arm and fingers. Usually, this condition gets better with rest. Treatment may be needed if the condition does not improve. What are the causes? This condition may be caused by:  A neck injury.  A bulging (herniated) disk.  Muscle spasms.  Muscle tightness in the neck because of overuse.  Arthritis.  Breakdown or degeneration in the bones and joints of the spine (spondylosis) due to aging.  Bone spurs that may develop near the cervical nerves. What are the signs or symptoms? Symptoms of this condition include:  Pain. The pain may travel from the neck to the arm and hand. The pain can be severe or irritating. It may be worse when you move your neck.  Numbness or tingling in your arm or hand.  Weakness in the affected arm and hand, in severe cases. How is this diagnosed? This condition may be diagnosed based on your symptoms, your medical history, and a physical exam. You may also have tests, including:  X-rays.  A CT scan.  An MRI.  An electromyogram (EMG).  Nerve conduction tests. How is this treated? In many cases, treatment is not needed for this condition. With rest, the condition usually gets better over time. If treatment is needed, options may include:  Wearing a soft neck collar (cervical collar) for short  periods of time, as told by your health care provider.  Doing physical therapy to strengthen your neck muscles.  Taking medicines, such as NSAIDs or oral corticosteroids.  Having spinal injections, in severe cases.  Having surgery. This may be needed if other treatments do not help. Different types of surgery may be done depending on the cause of this condition. Follow these instructions at home: If you have a cervical collar:  Wear it as told by your health care provider. Remove it only as told by your health care provider.  Ask your health care provider if you can remove the collar for cleaning and bathing. If you are allowed to remove the collar for cleaning or bathing: ? Follow instructions from your health care provider about how to remove the collar safely. ? Clean the collar by wiping it with mild soap and water and drying it completely. ? Take out any removable pads in the collar every 1-2 days, and wash them by hand with soap and water. Let them air-dry completely before you put them back in the collar. ? Check your skin under the collar for irritation or sores. If you see any, tell your health care provider. Managing pain  Take over-the-counter and prescription medicines only as told by your health care provider.  If directed, put ice on the affected area. ? If you have a soft neck collar, remove it as told by your health care  provider. ? Put ice in a plastic bag. ? Place a towel between your skin and the bag. ? Leave the ice on for 20 minutes, 2-3 times a day.  If applying ice does not help, you can try using heat. Use the heat source that your health care provider recommends, such as a moist heat pack or a heating pad. ? Place a towel between your skin and the heat source. ? Leave the heat on for 20-30 minutes. ? Remove the heat if your skin turns bright red. This is especially important if you are unable to feel pain, heat, or cold. You may have a greater risk of getting  burned.  Try a gentle neck and shoulder massage to help relieve symptoms.      Activity  Rest as needed.  Return to your normal activities as told by your health care provider. Ask your health care provider what activities are safe for you.  Do stretching and strengthening exercises as told by your health care provider or physical therapist.  Do not lift anything that is heavier than 10 lb (4.5 kg) until your health care provider tells you that it is safe. General instructions  Use a flat pillow when you sleep.  Do not drive while wearing a cervical collar. If you do not have a cervical collar, ask your health care provider if it is safe to drive while your neck heals.  Ask your health care provider if the medicine prescribed to you requires you to avoid driving or using heavy machinery.  Do not use any products that contain nicotine or tobacco, such as cigarettes, e-cigarettes, and chewing tobacco. These can delay healing. If you need help quitting, ask your health care provider.  Keep all follow-up visits as told by your health care provider. This is important. Contact a health care provider if:  Your condition does not improve with treatment. Get help right away if:  Your pain gets much worse and cannot be controlled with medicines.  You have weakness or numbness in your hand, arm, face, or leg.  You have a high fever.  You have a stiff, rigid neck.  You lose control of your bowels or your bladder (have incontinence).  You have trouble with walking, balance, or speaking. Summary  Cervical radiculopathy happens when a nerve in the neck is pinched or bruised.  A nerve can get pinched from a bulging disk, arthritis, muscle spasms, or an injury to the neck.  Symptoms include pain, tingling, or numbness radiating from the neck into the arm or hand. Weakness can also occur in severe cases.  Treatment may include rest, wearing a cervical collar, and physical therapy.  Medicines may be prescribed to help with pain. In severe cases, injections or surgery may be needed. This information is not intended to replace advice given to you by your health care provider. Make sure you discuss any questions you have with your health care provider. Document Revised: 06/25/2018 Document Reviewed: 06/25/2018 Elsevier Patient Education  2021 [employer].

## 2021-03-15 ENCOUNTER — Encounter: Payer: Self-pay | Admitting: Family Medicine

## 2021-03-15 DIAGNOSIS — M5412 Radiculopathy, cervical region: Secondary | ICD-10-CM

## 2021-03-18 NOTE — Telephone Encounter (Signed)
I think physical therapy is a great idea.  Can you remind me where regionally you live so I can pick a good location near to where you live?  Ellard Artis

## 2021-05-01 ENCOUNTER — Telehealth: Payer: Self-pay

## 2021-05-01 NOTE — Telephone Encounter (Signed)
Left a massage for pt to call the office and schedule an office visit

## 2021-05-01 NOTE — Telephone Encounter (Signed)
Yes I agreed to see him, please set up an intro visit

## 2021-05-01 NOTE — Telephone Encounter (Signed)
Called patient message given.   Will call office back at his convenience to schedule new patient appointment.

## 2021-05-01 NOTE — Telephone Encounter (Signed)
Please advise 

## 2021-05-01 NOTE — Telephone Encounter (Signed)
Patient called stating he was accepted as a new patient with Dr. Sarajane Jews and wants to schedule appt I informed patient that I would send a message to Dr. Sarajane Jews to verify ok.

## 2021-05-09 ENCOUNTER — Encounter: Payer: Self-pay | Admitting: Family Medicine

## 2021-05-09 ENCOUNTER — Ambulatory Visit (INDEPENDENT_AMBULATORY_CARE_PROVIDER_SITE_OTHER): Payer: Medicare HMO | Admitting: Family Medicine

## 2021-05-09 ENCOUNTER — Other Ambulatory Visit: Payer: Self-pay

## 2021-05-09 VITALS — BP 120/76 | HR 52 | Temp 97.8°F | Ht 68.75 in | Wt 190.5 lb

## 2021-05-09 DIAGNOSIS — K5730 Diverticulosis of large intestine without perforation or abscess without bleeding: Secondary | ICD-10-CM | POA: Diagnosis not present

## 2021-05-09 DIAGNOSIS — C4491 Basal cell carcinoma of skin, unspecified: Secondary | ICD-10-CM

## 2021-05-09 DIAGNOSIS — M5137 Other intervertebral disc degeneration, lumbosacral region: Secondary | ICD-10-CM

## 2021-05-09 DIAGNOSIS — M5412 Radiculopathy, cervical region: Secondary | ICD-10-CM | POA: Diagnosis not present

## 2021-05-09 DIAGNOSIS — M51379 Other intervertebral disc degeneration, lumbosacral region without mention of lumbar back pain or lower extremity pain: Secondary | ICD-10-CM

## 2021-05-09 NOTE — Progress Notes (Signed)
   Subjective:    Patient ID: [name] [name], male    DOB: 09-25-1955, 65 y.o.   MRN: [id number]  HPI Here to establish after transferring from [facility]. He is doing well all in all. His last well exam was in November 2021. He sees Dr. Evorn Gong at [facility] and he has had a number of basal cell cancers and one squamous cell cancer removed. He sees Dr. Lynne Leader for cervical radiculopathy (which causes tingling in the right arm), peroneal tendonitis, and plantar fasciitis. All these are fairly well controlled now.    Review of Systems  Constitutional: Negative.   Respiratory: Negative.    Cardiovascular: Negative.   Gastrointestinal: Negative.   Genitourinary: Negative.       Objective:   Physical Exam Constitutional:      Appearance: Normal appearance.  Cardiovascular:     Rate and Rhythm: Normal rate and regular rhythm.     Pulses: Normal pulses.     Heart sounds: Normal heart sounds.  Pulmonary:     Effort: Pulmonary effort is normal.     Breath sounds: Normal breath sounds.  Neurological:     Mental Status: He is alert.          Assessment & Plan:  Intro visit for this fairly healthy man. We will plan for him to return this November for another well exam. We spent a total of ( 49  ) minutes reviewing records and discussing these issues.   Alysia Penna, MD

## 2021-07-01 ENCOUNTER — Encounter: Payer: Self-pay | Admitting: Family Medicine

## 2021-07-04 ENCOUNTER — Encounter: Payer: Self-pay | Admitting: Family Medicine

## 2021-07-04 ENCOUNTER — Ambulatory Visit (INDEPENDENT_AMBULATORY_CARE_PROVIDER_SITE_OTHER): Payer: Medicare HMO | Admitting: Family Medicine

## 2021-07-04 VITALS — BP 120/78 | HR 54 | Temp 97.9°F | Ht 68.75 in | Wt 191.0 lb

## 2021-07-04 DIAGNOSIS — Z23 Encounter for immunization: Secondary | ICD-10-CM | POA: Diagnosis not present

## 2021-07-04 DIAGNOSIS — N529 Male erectile dysfunction, unspecified: Secondary | ICD-10-CM

## 2021-07-04 DIAGNOSIS — R739 Hyperglycemia, unspecified: Secondary | ICD-10-CM

## 2021-07-04 DIAGNOSIS — E785 Hyperlipidemia, unspecified: Secondary | ICD-10-CM

## 2021-07-04 DIAGNOSIS — C4491 Basal cell carcinoma of skin, unspecified: Secondary | ICD-10-CM

## 2021-07-04 LAB — CBC WITH DIFFERENTIAL/PLATELET
Basophils Absolute: 0 10*3/uL (ref 0.0–0.1)
Basophils Relative: 0.5 % (ref 0.0–3.0)
Eosinophils Absolute: 0.1 10*3/uL (ref 0.0–0.7)
Eosinophils Relative: 1.7 % (ref 0.0–5.0)
HCT: 43.9 % (ref 39.0–52.0)
Hemoglobin: 14.6 g/dL (ref 13.0–17.0)
Lymphocytes Relative: 31.6 % (ref 12.0–46.0)
Lymphs Abs: 1.1 10*3/uL (ref 0.7–4.0)
MCHC: 33.4 g/dL (ref 30.0–36.0)
MCV: 95.5 fl (ref 78.0–100.0)
Monocytes Absolute: 0.3 10*3/uL (ref 0.1–1.0)
Monocytes Relative: 9.5 % (ref 3.0–12.0)
Neutro Abs: 2 10*3/uL (ref 1.4–7.7)
Neutrophils Relative %: 56.7 % (ref 43.0–77.0)
Platelets: 163 10*3/uL (ref 150.0–400.0)
RBC: 4.6 Mil/uL (ref 4.22–5.81)
RDW: 13.4 % (ref 11.5–15.5)
WBC: 3.5 10*3/uL — ABNORMAL LOW (ref 4.0–10.5)

## 2021-07-04 LAB — BASIC METABOLIC PANEL
BUN: 13 mg/dL (ref 6–23)
CO2: 31 mEq/L (ref 19–32)
Calcium: 9.8 mg/dL (ref 8.4–10.5)
Chloride: 101 mEq/L (ref 96–112)
Creatinine, Ser: 1.05 mg/dL (ref 0.40–1.50)
GFR: 73.79 mL/min (ref >60.00)
Glucose, Bld: 90 mg/dL (ref 70–99)
Potassium: 5.2 mEq/L — ABNORMAL HIGH (ref 3.5–5.1)
Sodium: 140 mEq/L (ref 135–145)

## 2021-07-04 LAB — PSA: PSA: 0.91 ng/mL (ref 0.10–4.00)

## 2021-07-04 LAB — LIPID PANEL
Cholesterol: 237 mg/dL — ABNORMAL HIGH (ref 0–200)
HDL: 85.6 mg/dL (ref >39.00)
LDL Cholesterol: 136 mg/dL — ABNORMAL HIGH (ref 0–99)
NonHDL: 150.99
Total CHOL/HDL Ratio: 3
Triglycerides: 74 mg/dL (ref 0.0–149.0)
VLDL: 14.8 mg/dL (ref 0.0–40.0)

## 2021-07-04 LAB — HEPATIC FUNCTION PANEL
ALT: 10 U/L (ref 0–53)
AST: 22 U/L (ref 0–37)
Albumin: 4.8 g/dL (ref 3.5–5.2)
Alkaline Phosphatase: 53 U/L (ref 39–117)
Bilirubin, Direct: 0.1 mg/dL (ref 0.0–0.3)
Total Bilirubin: 0.7 mg/dL (ref 0.2–1.2)
Total Protein: 7.1 g/dL (ref 6.0–8.3)

## 2021-07-04 LAB — HEMOGLOBIN A1C: Hgb A1c MFr Bld: 5.6 % (ref 4.6–6.5)

## 2021-07-04 LAB — TSH: TSH: 1.2 u[IU]/mL (ref 0.35–5.50)

## 2021-07-04 LAB — TESTOSTERONE: Testosterone: 272.81 ng/dL — ABNORMAL LOW (ref 300.00–890.00)

## 2021-07-04 MED ORDER — SILDENAFIL CITRATE 100 MG PO TABS: 100.0000 mg | ORAL_TABLET | ORAL | 5 refills | Status: DC | PRN

## 2021-07-04 NOTE — Progress Notes (Signed)
Subjective:    Patient ID: [name] [name], male    DOB: [birth year], 65 y.o.   MRN: [id number]  HPI Here to follow up on issues. He feels well but he mentions trouble with erections at times. His libido is intact. He tried Cialis in the past but this caused his legs to ache. He saw his dermatologist a few days ago and she removed another basal cell cancer from his left temple. He has had  mild elevations of cholesterol in the past, so he tried to eat a healthy diet.    Review of Systems  Constitutional: Negative.   HENT: Negative.    Eyes: Negative.   Respiratory: Negative.    Cardiovascular: Negative.   Gastrointestinal: Negative.   Genitourinary: Negative.   Musculoskeletal: Negative.   Skin: Negative.   Neurological: Negative.   Psychiatric/Behavioral: Negative.        Objective:   Physical Exam Constitutional:      General: He is not in acute distress.    Appearance: Normal appearance. He is well-developed. He is not diaphoretic.  HENT:     Head: Normocephalic and atraumatic.     Right Ear: External ear normal.     Left Ear: External ear normal.     Nose: Nose normal.     Mouth/Throat:     Pharynx: No oropharyngeal exudate.  Eyes:     General: No scleral icterus.       Right eye: No discharge.        Left eye: No discharge.     Conjunctiva/sclera: Conjunctivae normal.     Pupils: Pupils are equal, round, and reactive to light.  Neck:     Thyroid: No thyromegaly.     Vascular: No JVD.     Trachea: No tracheal deviation.  Cardiovascular:     Rate and Rhythm: Normal rate and regular rhythm.     Heart sounds: Normal heart sounds. No murmur heard.   No friction rub. No gallop.  Pulmonary:     Effort: Pulmonary effort is normal. No respiratory distress.     Breath sounds: Normal breath sounds. No wheezing or rales.  Chest:     Chest wall: No tenderness.  Abdominal:     General: Bowel sounds are normal. There is no distension.     Palpations: Abdomen is soft.  There is no mass.     Tenderness: There is no abdominal tenderness. There is no guarding or rebound.  Genitourinary:    Penis: Normal. No tenderness.      Testes: Normal.     Prostate: Normal.     Rectum: Normal. Guaiac result negative.  Musculoskeletal:        General: No tenderness. Normal range of motion.     Cervical back: Neck supple.  Lymphadenopathy:     Cervical: No cervical adenopathy.  Skin:    General: Skin is warm and dry.     Coloration: Skin is not pale.     Findings: No erythema or rash.  Neurological:     Mental Status: He is alert and oriented to person, place, and time.     Cranial Nerves: No cranial nerve deficit.     Motor: No abnormal muscle tone.     Coordination: Coordination normal.     Deep Tendon Reflexes: Reflexes are normal and symmetric. Reflexes normal.  Psychiatric:        Behavior: Behavior normal.        Thought Content: Thought content normal.  Judgment: Judgment normal.          Assessment & Plan:  He is doing well in general. We will get fasting labs to check lipids, etc. We will also check a testosterone level for the ED. He will try Viagra 100 mg as needed. Given a pneumococcal 23 vaccine. We spent a total of (30   ) minutes reviewing records and discussing these issues.  Alysia Penna, MD

## 2021-07-04 NOTE — Addendum Note (Signed)
Addended by: Wyvonne Lenz on: 07/04/2021 09:16 AM   Modules accepted: Orders

## 2021-07-15 ENCOUNTER — Encounter: Payer: Self-pay | Admitting: Family Medicine

## 2021-07-16 MED ORDER — TESTOSTERONE 25 MG/2.5GM (1%) TD GEL: 2.5000 g | Freq: Every day | TRANSDERMAL | 5 refills | Status: DC

## 2021-07-16 NOTE — Telephone Encounter (Signed)
I sent in the gel RX

## 2021-07-16 NOTE — Telephone Encounter (Signed)
The most popular forms are Testosterone gel 1.62% for the skin or testosterone cypionate (shots)

## 2021-07-24 ENCOUNTER — Encounter: Payer: Self-pay | Admitting: Family Medicine

## 2021-07-30 ENCOUNTER — Telehealth: Payer: Self-pay

## 2021-07-30 DIAGNOSIS — E291 Testicular hypofunction: Secondary | ICD-10-CM

## 2021-07-30 NOTE — Telephone Encounter (Signed)
We already have one early morning level. I just put in the order for a second level, so he needs to make an early morning lab appt

## 2021-07-30 NOTE — Telephone Encounter (Signed)
The office has received a form from [employer] requesting for more information on pt diagnoses, requests for atleast two confirmed low morning testosterone levels according to practice guideline. Message sent to Dr Sarajane Jews for advise

## 2021-07-30 NOTE — Telephone Encounter (Signed)
Spoke with pt regarding request for a second Testosterone level from his insurance, pt state that he will call the office to schedule a lab appointment

## 2021-08-01 ENCOUNTER — Other Ambulatory Visit (INDEPENDENT_AMBULATORY_CARE_PROVIDER_SITE_OTHER): Payer: Medicare HMO

## 2021-08-01 DIAGNOSIS — E291 Testicular hypofunction: Secondary | ICD-10-CM

## 2021-08-02 LAB — TESTOSTERONE: Testosterone: 220.91 ng/dL — ABNORMAL LOW (ref 300.00–890.00)

## 2021-08-06 ENCOUNTER — Telehealth: Payer: Self-pay | Admitting: Family Medicine

## 2021-08-06 NOTE — Telephone Encounter (Signed)
Left message for patient to call back and schedule Medicare Annual Wellness Visit (AWV) either virtually or in office. Left  my Herbie Drape number [phone]   Last WTM 07/03/20 please schedule at anytime with [facility] Nurse Health Advisor 1 or 2   This should be a 45 minute visit.

## 2021-08-07 NOTE — Telephone Encounter (Signed)
See results note-redetermination submitted via phone call to [place].

## 2021-08-15 ENCOUNTER — Encounter: Payer: Self-pay | Admitting: Family Medicine

## 2021-08-15 ENCOUNTER — Ambulatory Visit (INDEPENDENT_AMBULATORY_CARE_PROVIDER_SITE_OTHER): Payer: Medicare HMO

## 2021-08-15 VITALS — BP 122/62 | Temp 98.0°F | Ht 68.0 in | Wt 192.3 lb

## 2021-08-15 DIAGNOSIS — Z0000 Encounter for general adult medical examination without abnormal findings: Secondary | ICD-10-CM | POA: Diagnosis not present

## 2021-08-15 NOTE — Patient Instructions (Addendum)
[name] [name] , Thank you for taking time to come for your Medicare Wellness Visit. I appreciate your ongoing commitment to your health goals. Please review the following plan we discussed and let me know if I can assist you in the future.   These are the goals we discussed:  Goals      Increase physical activity     Patient states want to do more traveling.        This is a list of the screening recommended for you and due dates:  Health Maintenance  Topic Date Due   Zoster (Shingles) Vaccine (1 of 2) 10/04/2021*   Colon Cancer Screening  04/08/2025   Tetanus Vaccine  08/08/2026   Pneumonia Vaccine  Completed   Flu Shot  Completed   COVID-19 Vaccine  Completed   Hepatitis C Screening: USPSTF Recommendation to screen - Ages 18-79 yo.  Completed   HPV Vaccine  Aged Out  *Topic was postponed. The date shown is not the original due date.    Advanced directives: No   Conditions/risks identified: None  Next appointment: Follow up in one year for your annual wellness visit   Preventive Care 65 Years and Older, Male Preventive care refers to lifestyle choices and visits with your health care provider that can promote health and wellness. What does preventive care include? A yearly physical exam. This is also called an annual well check. Dental exams once or twice a year. Routine eye exams. Ask your health care provider how often you should have your eyes checked. Personal lifestyle choices, including: Daily care of your teeth and gums. Regular physical activity. Eating a healthy diet. Avoiding tobacco and drug use. Limiting alcohol use. Practicing safe sex. Taking low-dose aspirin every day. Taking vitamin and mineral supplements as recommended by your health care provider. What happens during an annual well check? The services and screenings done by your health care provider during your annual well check will depend on your age, overall health, lifestyle risk factors, and  family history of disease. Counseling  Your health care provider may ask you questions about your: Alcohol use. Tobacco use. Drug use. Emotional well-being. Home and relationship well-being. Sexual activity. Eating habits. History of falls. Memory and ability to understand (cognition). Work and work Statistician. Reproductive health. Screening  You may have the following tests or measurements: Height, weight, and BMI. Blood pressure. Lipid and cholesterol levels. These may be checked every 5 years, or more frequently if you are over 13 years old. Skin check. Lung cancer screening. You may have this screening every year starting at age 75 if you have a 30-pack-year history of smoking and currently smoke or have quit within the past 15 years. Fecal occult blood test (FOBT) of the stool. You may have this test every year starting at age 68. Flexible sigmoidoscopy or colonoscopy. You may have a sigmoidoscopy every 5 years or a colonoscopy every 10 years starting at age 82. Hepatitis C blood test. Hepatitis B blood test. Sexually transmitted disease (STD) testing. Diabetes screening. This is done by checking your blood sugar (glucose) after you have not eaten for a while (fasting). You may have this done every 1-3 years. Bone density scan. This is done to screen for osteoporosis. You may have this done starting at age 6. Mammogram. This may be done every 1-2 years. Talk to your health care provider about how often you should have regular mammograms. Talk with your health care provider about your test results, treatment options,  and if necessary, the need for more tests. Vaccines  Your health care provider may recommend certain vaccines, such as: Influenza vaccine. This is recommended every year. Tetanus, diphtheria, and acellular pertussis (Tdap, Td) vaccine. You may need a Td booster every 10 years. Zoster vaccine. You may need this after age 6. Pneumococcal 13-valent conjugate  (PCV13) vaccine. One dose is recommended after age 49. Pneumococcal polysaccharide (PPSV23) vaccine. One dose is recommended after age 24. Talk to your health care provider about which screenings and vaccines you need and how often you need them. This information is not intended to replace advice given to you by your health care provider. Make sure you discuss any questions you have with your health care provider. Document Released: 08/31/2015 Document Revised: 04/23/2016 Document Reviewed: 06/05/2015 Elsevier Interactive Patient Education  2017 [place] Prevention in the Home Falls can cause injuries. They can happen to people of all ages. There are many things you can do to make your home safe and to help prevent falls. What can I do on the outside of my home? Regularly fix the edges of walkways and driveways and fix any cracks. Remove anything that might make you trip as you walk through a door, such as a raised step or threshold. Trim any bushes or trees on the path to your home. Use bright outdoor lighting. Clear any walking paths of anything that might make someone trip, such as rocks or tools. Regularly check to see if handrails are loose or broken. Make sure that both sides of any steps have handrails. Any raised decks and porches should have guardrails on the edges. Have any leaves, snow, or ice cleared regularly. Use sand or salt on walking paths during winter. Clean up any spills in your garage right away. This includes oil or grease spills. What can I do in the bathroom? Use night lights. Install grab bars by the toilet and in the tub and shower. Do not use towel bars as grab bars. Use non-skid mats or decals in the tub or shower. If you need to sit down in the shower, use a plastic, non-slip stool. Keep the floor dry. Clean up any water that spills on the floor as soon as it happens. Remove soap buildup in the tub or shower regularly. Attach bath mats securely with  double-sided non-slip rug tape. Do not have throw rugs and other things on the floor that can make you trip. What can I do in the bedroom? Use night lights. Make sure that you have a light by your bed that is easy to reach. Do not use any sheets or blankets that are too big for your bed. They should not hang down onto the floor. Have a firm chair that has side arms. You can use this for support while you get dressed. Do not have throw rugs and other things on the floor that can make you trip. What can I do in the kitchen? Clean up any spills right away. Avoid walking on wet floors. Keep items that you use a lot in easy-to-reach places. If you need to reach something above you, use a strong step stool that has a grab bar. Keep electrical cords out of the way. Do not use floor polish or wax that makes floors slippery. If you must use wax, use non-skid floor wax. Do not have throw rugs and other things on the floor that can make you trip. What can I do with my stairs? Do not leave  any items on the stairs. Make sure that there are handrails on both sides of the stairs and use them. Fix handrails that are broken or loose. Make sure that handrails are as long as the stairways. Check any carpeting to make sure that it is firmly attached to the stairs. Fix any carpet that is loose or worn. Avoid having throw rugs at the top or bottom of the stairs. If you do have throw rugs, attach them to the floor with carpet tape. Make sure that you have a light switch at the top of the stairs and the bottom of the stairs. If you do not have them, ask someone to add them for you. What else can I do to help prevent falls? Wear shoes that: Do not have high heels. Have rubber bottoms. Are comfortable and fit you well. Are closed at the toe. Do not wear sandals. If you use a stepladder: Make sure that it is fully opened. Do not climb a closed stepladder. Make sure that both sides of the stepladder are locked  into place. Ask someone to hold it for you, if possible. Clearly mark and make sure that you can see: Any grab bars or handrails. First and last steps. Where the edge of each step is. Use tools that help you move around (mobility aids) if they are needed. These include: Canes. Walkers. Scooters. Crutches. Turn on the lights when you go into a dark area. Replace any light bulbs as soon as they burn out. Set up your furniture so you have a clear path. Avoid moving your furniture around. If any of your floors are uneven, fix them. If there are any pets around you, be aware of where they are. Review your medicines with your doctor. Some medicines can make you feel dizzy. This can increase your chance of falling. Ask your doctor what other things that you can do to help prevent falls. This information is not intended to replace advice given to you by your health care provider. Make sure you discuss any questions you have with your health care provider. Document Released: 05/31/2009 Document Revised: 01/10/2016 Document Reviewed: 09/08/2014 Elsevier Interactive Patient Education  2017 [employer].

## 2021-08-15 NOTE — Progress Notes (Addendum)
Subjective:   [name] [name] is a 65 y.o. male who presents for Medicare Annual/Subsequent preventive examination.  Review of Systems    No ROS Cardiac Risk Factors include: advanced age (>55mn, >>30women)    Objective:    Today's Vitals   08/15/21 1018  BP: 122/62  Temp: 98 F (36.7 C)  SpO2: 99%  Weight: 192 lb 4.8 oz (87.2 kg)  Height: _0  (1.727 m)   Body mass index is 29.24 kg/m.  Advanced Directives 08/15/2021 03/26/2015  Does Patient Have a Medical Advance Directive? No No  Would patient like information on creating a medical advance directive? No - Patient declined No - patient declined information    Current Medications (verified) Outpatient Encounter Medications as of 08/15/2021  Medication Sig   AMBULATORY NON FORMULARY MEDICATION 20 mg 2 (two) times daily. CBD oil   diclofenac Sodium (VOLTAREN) 1 % GEL 4 g.   sildenafil (VIAGRA) 100 MG tablet Take 1 tablet (100 mg total) by mouth as needed for erectile dysfunction.   Testosterone 25 MG/2.5GM (1%) GEL Apply 2.5 g topically daily.   triamcinolone (NASACORT) 55 MCG/ACT AERO nasal inhaler    No facility-administered encounter medications on file as of 08/15/2021.    Allergies (verified) Amoxicillin-pot clavulanate   History: Past Medical History:  Diagnosis Date   Basal cell carcinoma (BCC) of upper lip 09/2018   Basal cell carcinoma of face    5 of the face   CARCINOMA, BASAL CELL, RECURRENT 09/07/2008   [place] Dr. [place] LUMBAR SPINE 09/07/2008   DIVERTICULOSIS, COLON 11/03/2007   LOW BACK PAIN, CHRONIC 12/18/2008   Past Surgical History:  Procedure Laterality Date   BASAL CELL CARCINOMA EXCISION  07/30/2015   temporal left side, back of head right side   COLONOSCOPY  04/09/2015   per Dr. GCarlean Purl sigmoid diverticula, no polyps, repeat in 10 yrs   Exploration for Undescended testicle      age 65  fractured patella     teenager   fract[place]  wrist     [place] 89  [place]    left at age 65(concomittently with exploratory surgery)   TONSILLECTOMY AND ADENOIDECTOMY     age 65  Family History  Problem Relation Age of Onset   Hyperlipidemia Mother    Hypertension Father    Atrial fibrillation Father    Obesity Father    Heart disease Father        a. fib   Cancer Maternal Grandmother        Uterine   Cancer Paternal Grandmother        Breast   Stroke Paternal Grandmother    Hyperlipidemia Other    Diabetes Other    Colon cancer Neg Hx    Colon polyps Neg Hx    Social History   Socioeconomic History   Marital status: Married    Spouse name: Not on file   Number of children: Not on file   Years of education: Not on file   Highest education level: Not on file  Occupational History   Occupation: [employer]  Tobacco Use   Smoking status: Former    Types: Cigarettes    Quit date: 04/09/1977    Years since quitting: 44.3   Smokeless tobacco: Never  Vaping Use   Vaping Use: Never used  Substance and Sexual Activity   Alcohol use: Yes  Alcohol/week: 12.0 standard drinks    Types: 12 Standard drinks or equivalent per week    Comment: moderate--2 beers daily   Drug use: No   Sexual activity: Yes    Partners: Female  Other Topics Concern   Not on file  Social History Narrative   [place]; MBA [employer]. Married '79. 1 daughter -'68 (nurse), 1 son '88. [employer]. 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 Marriage in good health      Some beer drinking, former smoker no drug use   Social Determinants of Health   Financial Resource Strain: Low Risk    Difficulty of Paying Living Expenses: Not hard at all  Food Insecurity: No Food Insecurity   Worried About Charity fundraiser in the Last Year: Never true   Arboriculturist in the Last Year: Never true  Transportation Needs: No Transportation Needs   Lack  of Transportation (Medical): No   Lack of Transportation (Non-Medical): No  Physical Activity: Sufficiently Active   Days of Exercise per Week: 5 days   Minutes of Exercise per Session: 30 min  Stress: No Stress Concern Present   Feeling of Stress : Not at all  Social Connections: Socially Integrated   Frequency of Communication with Friends and Family: More than three times a week   Frequency of Social Gatherings with Friends and Family: More than three times a week   Attends Religious Services: More than 4 times per year   Active Member of [employer] or Organizations: Yes   Attends Music therapist: More than 4 times per year   Marital Status: Married   Clinical Intake:  Pre-visit preparation completed: YesHow often do you need to have someone help you when you read instructions, pamphlets, or other written materials from your doctor or pharmacy?: 1 - Never  Diabetic? No   Activities of Daily Living In your present state of health, do you have any difficulty performing the following activities: 08/15/2021 08/15/2021  Hearing? N N  Comment - -  Vision? N N  Difficulty concentrating or making decisions? N N  Walking or climbing stairs? N N  Dressing or bathing? N N  Doing errands, shopping? N N  Preparing Food and eating ? N N  Using the Toilet? N N  In the past six months, have you accidently leaked urine? N N  Do you have problems with loss of bowel control? N N  Managing your Medications? N N  Managing your Finances? N N  Housekeeping or managing your Housekeeping? N N  Some recent data might be hidden    Patient Care Team: Laurey Morale, MD as PCP - General (Family Medicine)  Indicate any recent Medical Services you may have received from other than [facility] providers in the past year (date may be approximate).     Assessment:   This is a routine wellness examination for [name] [name].  Hearing/Vision screen Hearing Screening - Comments:: Wears hearing aids. Vision  Screening - Comments:: Wears glasses. Follow[facility]  Dietary issues and exercise activities discussed: Current Exercise Habits: Home exercise routine, Type of exercise: walking, Time (Minutes): 30, Frequency (Times/Week): 5, Weekly Exercise (Minutes/Week): 150, Intensity: Moderate   Goals Addressed             This Visit's Progress    Increase physical activity       Patient states want to do more traveling.  Depression Screen PHQ 2/9 Scores 08/15/2021 07/04/2021 05/09/2021 07/03/2020 05/26/2019 04/06/2018 01/13/2017  PHQ - 2 Score 0 0 0 0 0 0 0  PHQ- 9 Score 0 1 1 - - - -    Fall Risk Fall Risk  08/15/2021 08/15/2021 07/04/2021 07/03/2020 01/13/2017  Falls in the past year? 0 0 0 0 No  Number falls in past yr: 0 - 0 - -  Injury with Fall? 0 - 0 - -  Risk for fall due to : - - No Fall Risks - -    FALL RISK PREVENTION PERTAINING TO THE HOME:  Any stairs in or around the home? Yes  If so, are there any without handrails? No  Home free of loose throw rugs in walkways, pet beds, electrical cords, etc? Yes  Adequate lighting in your home to reduce risk of falls? Yes   ASSISTIVE DEVICES UTILIZED TO PREVENT FALLS:  Life alert? No  Use of a cane, walker or w/c? No  Grab bars in the bathroom? No  Shower chair or bench in shower? No  Elevated toilet seat or a handicapped toilet? No   TIMED UP AND GO:  Was the test performed? Yes .  Length of time to ambulate 10 feet: 5 sec.   Gait steady and fast without use of assistive device  Cognitive Function:     Immunizations Immunization History  Administered Date(s) Administered   Hepatitis A, Adult 08/06/2016, 04/28/2017   Hepatitis B 04/01/1994, 05/06/1994, 10/10/1994   Influenza Whole 06/17/2012   Influenza, High Dose Seasonal PF 05/31/2020   Influenza,inj,Quad PF,6+ Mos 05/19/2016, 05/19/2019   Influenza-Unspecified 04/25/2015, 05/27/2017, 05/26/2018, 05/27/2021   MMR 04/27/1995   Moderna  Covid-19 Vaccine Bivalent Booster 48yr & up 05/02/2021   Moderna Sars-Covid-2 Vaccination 09/21/2019, 10/19/2019, 11/21/2019, 06/19/2020   Pneumococcal Conjugate-13 07/03/2020   Pneumococcal Polysaccharide-23 07/04/2021   Td 08/08/2016   Zoster, Live 12/24/2015    Screening Tests Health Maintenance  Topic Date Due   Zoster Vaccines- Shingrix (1 of 2) 10/04/2021 (Originally 08/29/1974)   COLONOSCOPY (Pts 45-435yrInsurance coverage will need to be confirmed)  04/08/2025   TETANUS/TDAP  08/08/2026   Pneumonia Vaccine 6540Years old  Completed   INFLUENZA VACCINE  Completed   COVID-19 Vaccine  Completed   Hepatitis C Screening  Completed   HPV VACCINES  Aged Out    Health Maintenance  There are no preventive care reminders to display for this patient.    Vision Screening: Recommended annual ophthalmology exams for early detection of glaucoma and other disorders of the eye. Is the patient up to date with their annual eye exam?  Yes  Who is the provider or what is the name of the office in which the patient attends annual eye exams? [facility]: Recommended annual dental exams for proper oral hygiene  Community Resource Referral / Chronic Care Management:  CRR required this visit?  No   CCM required this visit?  No      Plan:     I have personally reviewed and noted the following in the patients chart:   Medical and social history Use of alcohol, tobacco or illicit drugs  Current medications and supplements including opioid prescriptions. Patient is not currently taking opioid prescriptions. Functional ability and status Nutritional status Physical activity Advanced directives List of other physicians Hospitalizations, surgeries, and ER visits in previous 12 months Vitals Screenings to include cognitive, depression, and falls Referrals and appointments  In addition, I  have reviewed and discussed with patient certain preventive  protocols, quality metrics, and best practice recommendations. A written personalized care plan for preventive services as well as general preventive health recommendations were provided to patient.     Criselda Peaches, LPN   [id number]

## 2021-08-16 ENCOUNTER — Telehealth: Payer: Self-pay

## 2021-08-16 NOTE — Telephone Encounter (Signed)
Routing to PCP CMA  

## 2021-08-16 NOTE — Telephone Encounter (Signed)
Pt appeal forms have been completed by Dr Sarajane Jews and faxed to [place], attached are pt recent Testosterone lab results. Awaiting for to hear back

## 2022-01-30 ENCOUNTER — Encounter: Payer: Self-pay | Admitting: Family Medicine

## 2022-01-31 MED ORDER — TESTOSTERONE 25 MG/2.5GM (1%) TD GEL: 2.5000 g | Freq: Every day | TRANSDERMAL | 5 refills | Status: DC

## 2022-01-31 NOTE — Telephone Encounter (Signed)
Done

## 2022-04-15 ENCOUNTER — Ambulatory Visit (INDEPENDENT_AMBULATORY_CARE_PROVIDER_SITE_OTHER): Payer: Medicare HMO

## 2022-04-15 ENCOUNTER — Ambulatory Visit: Payer: Medicare HMO | Admitting: Family Medicine

## 2022-04-15 ENCOUNTER — Ambulatory Visit: Payer: Self-pay

## 2022-04-15 VITALS — BP 130/82 | HR 58 | Ht 68.0 in | Wt 190.0 lb

## 2022-04-15 DIAGNOSIS — M25512 Pain in left shoulder: Secondary | ICD-10-CM | POA: Diagnosis not present

## 2022-04-15 DIAGNOSIS — M1711 Unilateral primary osteoarthritis, right knee: Secondary | ICD-10-CM | POA: Diagnosis not present

## 2022-04-15 DIAGNOSIS — M5412 Radiculopathy, cervical region: Secondary | ICD-10-CM | POA: Diagnosis not present

## 2022-04-15 MED ORDER — DICLOFENAC SODIUM 2 % EX SOLN: 2.0000 | Freq: Two times a day (BID) | CUTANEOUS | 11 refills | Status: DC

## 2022-04-15 NOTE — Progress Notes (Signed)
I, [name], LAT, ATC acting as a scribe for Lynne Leader, MD.  [name] [name] is a 66 y.o. male who presents to [facility] today for L shoulder pain. Pt is R-hand dominate. Pt was previously seen by Dr. Georgina Snell on 01/09/21 for R-sided cervical radiculopathy. Today, pt c/o L shoulder pain x 2 weeks. Was doing some work building handicap ramps and then began to notice pain in his L shoulder. Pt locates pain to deep within the L shoulder joint. Pt suffered a "pop" in his L shoulder when rolling onto his L side during the night.   History of right cervical radiculopathy at C7 May 2022.  He was prescribed prednisone and gabapentin but the symptoms resolved spontaneously without ever having to take the medication.  He still has the prednisone and gabapentin at home.   Radiates: into L upper arm  Mechanical symptoms: Numbness/tingling: yes Aggravates: nothing in particular Treatments tried: Pennsaid, Tylenol  Pt also c/o R knee pain that became painful this morning.  He has a history of knee DJD that has been treatable in the past with NSAID.  He notes it is a little painful but denies significant mechanical symptoms.  He is interested in a refill of Pennsaid if possible.  Pertinent review of systems: No fevers or chills  Relevant historical information: Dyslipidemia   Exam:  BP 130/82   Pulse (!) 58   Ht '5\' 8"'$  (1.727 m)   Wt 190 lb (86.2 kg)   SpO2 98%   BMI 28.89 kg/m  General: Well Developed, well nourished, and in no acute distress.   MSK: C-spine: Normal appearing Nontender midline. Cervical range of motion normal extension and flexion and rotation.  Limited lateral flexion bilaterally.  Positive left-sided Spurling's test. Upper summary strength is intact. Reflexes are intact. Pulses capillary fill and sensation are intact distally.  Left shoulder: Normal-appearing Normal motion. Intact strength. Negative Hawkins and Neer's test.   Negative Yergason's and speeds test.  Right knee: Normal appearing. Normal motion with crepitation.   Lab and Radiology Results \ X-ray images C-spine obtained today personally and independently interpreted. DDD C4-5 and C6-7.  No acute fractures. Await formal radiology review     Assessment and Plan: 66 y.o. male with left shoulder pain thought to be cervical radiculopathy.  Doubtful for shoulder dysfunction such as rotator cuff tendinitis as pain is not present with overhead motion.  Plan for a bit of watchful waiting.  Right knee DJD: Refill Pennsaid.   PDMP not reviewed this encounter. Orders Placed This Encounter  Procedures   DG Cervical Spine 2 or 3 views    Standing Status:   Future    Number of Occurrences:   1    Standing Expiration Date:   04/16/2023    Order Specific Question:   Reason for Exam (SYMPTOM  OR DIAGNOSIS REQUIRED)    Answer:   eval left cervical rad    Order Specific Question:   Preferred imaging location?    Answer:   [facility]   Meds ordered this encounter  Medications   diclofenac Sodium (PENNSAID) 2 % SOLN    Sig: Place 2 Pump (40 mg total) onto the skin 2 (two) times daily.    Dispense:  112 g    Refill:  11    Use [employer] program for discount.     Discussed warning signs or symptoms. Please see discharge instructions. Patient expresses understanding.   The above  documentation has been reviewed and is accurate and complete Lynne Leader, M.D.

## 2022-04-15 NOTE — Patient Instructions (Addendum)
Thank you for coming in today.   Ok to use that prednisone and gabapentin if needed.   Use a heating pad.   Please get an Xray today before you leave   Let me know

## 2022-04-16 NOTE — Progress Notes (Signed)
Cervical spine x-ray shows areas of significant arthritis changes.

## 2022-05-07 ENCOUNTER — Encounter: Payer: Self-pay | Admitting: Family Medicine

## 2022-05-08 ENCOUNTER — Other Ambulatory Visit: Payer: Self-pay

## 2022-05-08 ENCOUNTER — Encounter: Payer: Self-pay | Admitting: Family Medicine

## 2022-05-08 DIAGNOSIS — M1711 Unilateral primary osteoarthritis, right knee: Secondary | ICD-10-CM

## 2022-05-08 MED ORDER — DICLOFENAC SODIUM 2 % EX SOLN: 2.0000 | Freq: Two times a day (BID) | CUTANEOUS | 1 refills | Status: DC

## 2022-05-09 NOTE — Telephone Encounter (Signed)
He should contact [facility] about this

## 2022-06-11 ENCOUNTER — Encounter: Payer: Self-pay | Admitting: Family Medicine

## 2022-08-21 ENCOUNTER — Ambulatory Visit (INDEPENDENT_AMBULATORY_CARE_PROVIDER_SITE_OTHER): Payer: Medicare HMO

## 2022-08-21 ENCOUNTER — Ambulatory Visit (INDEPENDENT_AMBULATORY_CARE_PROVIDER_SITE_OTHER): Payer: Medicare HMO | Admitting: Family Medicine

## 2022-08-21 ENCOUNTER — Encounter: Payer: Self-pay | Admitting: Family Medicine

## 2022-08-21 VITALS — Ht 67.5 in | Wt 188.0 lb

## 2022-08-21 VITALS — BP 122/80 | HR 51 | Temp 97.8°F | Ht 67.5 in | Wt 188.0 lb

## 2022-08-21 DIAGNOSIS — Z209 Contact with and (suspected) exposure to unspecified communicable disease: Secondary | ICD-10-CM

## 2022-08-21 DIAGNOSIS — R739 Hyperglycemia, unspecified: Secondary | ICD-10-CM | POA: Diagnosis not present

## 2022-08-21 DIAGNOSIS — E291 Testicular hypofunction: Secondary | ICD-10-CM | POA: Diagnosis not present

## 2022-08-21 DIAGNOSIS — M5412 Radiculopathy, cervical region: Secondary | ICD-10-CM | POA: Diagnosis not present

## 2022-08-21 DIAGNOSIS — M5137 Other intervertebral disc degeneration, lumbosacral region: Secondary | ICD-10-CM

## 2022-08-21 DIAGNOSIS — E785 Hyperlipidemia, unspecified: Secondary | ICD-10-CM | POA: Diagnosis not present

## 2022-08-21 DIAGNOSIS — M51379 Other intervertebral disc degeneration, lumbosacral region without mention of lumbar back pain or lower extremity pain: Secondary | ICD-10-CM

## 2022-08-21 DIAGNOSIS — Z0000 Encounter for general adult medical examination without abnormal findings: Secondary | ICD-10-CM

## 2022-08-21 DIAGNOSIS — N138 Other obstructive and reflux uropathy: Secondary | ICD-10-CM | POA: Diagnosis not present

## 2022-08-21 DIAGNOSIS — N401 Enlarged prostate with lower urinary tract symptoms: Secondary | ICD-10-CM | POA: Diagnosis not present

## 2022-08-21 LAB — BASIC METABOLIC PANEL
BUN: 14 mg/dL (ref 6–23)
CO2: 30 mEq/L (ref 19–32)
Calcium: 9.5 mg/dL (ref 8.4–10.5)
Chloride: 100 mEq/L (ref 96–112)
Creatinine, Ser: 0.95 mg/dL (ref 0.40–1.50)
GFR: 82.55 mL/min (ref >60.00)
Glucose, Bld: 93 mg/dL (ref 70–99)
Potassium: 4.2 mEq/L (ref 3.5–5.1)
Sodium: 139 mEq/L (ref 135–145)

## 2022-08-21 LAB — CBC WITH DIFFERENTIAL/PLATELET
Basophils Absolute: 0 10*3/uL (ref 0.0–0.1)
Basophils Relative: 0.5 % (ref 0.0–3.0)
Eosinophils Absolute: 0 10*3/uL (ref 0.0–0.7)
Eosinophils Relative: 0.9 % (ref 0.0–5.0)
HCT: 45.9 % (ref 39.0–52.0)
Hemoglobin: 15.5 g/dL (ref 13.0–17.0)
Lymphocytes Relative: 41.8 % (ref 12.0–46.0)
Lymphs Abs: 1.6 10*3/uL (ref 0.7–4.0)
MCHC: 33.8 g/dL (ref 30.0–36.0)
MCV: 95.1 fl (ref 78.0–100.0)
Monocytes Absolute: 0.5 10*3/uL (ref 0.1–1.0)
Monocytes Relative: 12.7 % — ABNORMAL HIGH (ref 3.0–12.0)
Neutro Abs: 1.7 10*3/uL (ref 1.4–7.7)
Neutrophils Relative %: 44.1 % (ref 43.0–77.0)
Platelets: 188 10*3/uL (ref 150.0–400.0)
RBC: 4.83 Mil/uL (ref 4.22–5.81)
RDW: 13.1 % (ref 11.5–15.5)
WBC: 3.8 10*3/uL — ABNORMAL LOW (ref 4.0–10.5)

## 2022-08-21 LAB — TESTOSTERONE: Testosterone: 331.87 ng/dL (ref 300.00–890.00)

## 2022-08-21 LAB — HEPATIC FUNCTION PANEL
ALT: 14 U/L (ref 0–53)
AST: 28 U/L (ref 0–37)
Albumin: 4.8 g/dL (ref 3.5–5.2)
Alkaline Phosphatase: 56 U/L (ref 39–117)
Bilirubin, Direct: 0.1 mg/dL (ref 0.0–0.3)
Total Bilirubin: 0.7 mg/dL (ref 0.2–1.2)
Total Protein: 7 g/dL (ref 6.0–8.3)

## 2022-08-21 LAB — LIPID PANEL
Cholesterol: 246 mg/dL — ABNORMAL HIGH (ref 0–200)
HDL: 80.1 mg/dL (ref >39.00)
LDL Cholesterol: 151 mg/dL — ABNORMAL HIGH (ref 0–99)
NonHDL: 166.07
Total CHOL/HDL Ratio: 3
Triglycerides: 73 mg/dL (ref 0.0–149.0)
VLDL: 14.6 mg/dL (ref 0.0–40.0)

## 2022-08-21 LAB — PSA: PSA: 0.73 ng/mL (ref 0.10–4.00)

## 2022-08-21 LAB — HEMOGLOBIN A1C: Hgb A1c MFr Bld: 5.7 % (ref 4.6–6.5)

## 2022-08-21 LAB — TSH: TSH: 1.06 u[IU]/mL (ref 0.35–5.50)

## 2022-08-21 MED ORDER — TESTOSTERONE 25 MG/2.5GM (1%) TD GEL: 2.5000 g | Freq: Every day | TRANSDERMAL | 3 refills | Status: DC

## 2022-08-21 MED ORDER — TYPHOID VACCINE PO CPDR
1.0000 | DELAYED_RELEASE_CAPSULE | ORAL | 0 refills | Status: DC
Start: 2022-08-21 — End: 2023-11-13

## 2022-08-21 MED ORDER — SILDENAFIL CITRATE 100 MG PO TABS: 100.0000 mg | ORAL_TABLET | ORAL | 5 refills | Status: DC | PRN

## 2022-08-21 NOTE — Progress Notes (Signed)
Subjective:    Patient ID: [name] [name], male    DOB: 08-12-1956, 67 y.o.   MRN: [id number]  HPI Here to follow up on issues. He feels well in general. He applies the testosterone gel daily and this is working well for him. He asks for typhoid vaccine capsules to cover him when he takes another mission trip to [place] this spring. 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 His low er back pain has been well controlled lately.    Review of Systems  Constitutional: Negative.   HENT: Negative.    Eyes: Negative.   Respiratory: Negative.    Cardiovascular: Negative.   Gastrointestinal: Negative.   Genitourinary: Negative.   Musculoskeletal: Negative.   Skin: Negative.   Neurological: Negative.   Psychiatric/Behavioral: Negative.         Objective:   Physical Exam Constitutional:      General: He is not in acute distress.    Appearance: Normal appearance. He is well-developed. He is not diaphoretic.  HENT:     Head: Normocephalic and atraumatic.     Right Ear: External ear normal.     Left Ear: External ear normal.     Nose: Nose normal.     Mouth/Throat:     Pharynx: No oropharyngeal exudate.  Eyes:     General: No scleral icterus.       Right eye: No discharge.        Left eye: No discharge.     Conjunctiva/sclera: Conjunctivae normal.     Pupils: Pupils are equal, round, and reactive to light.  Neck:     Thyroid: No thyromegaly.     Vascular: No JVD.     Trachea: No tracheal deviation.  Cardiovascular:     Rate and Rhythm: Normal rate and regular rhythm.     Heart sounds: Normal heart sounds. No murmur heard.    No friction rub. No gallop.  Pulmonary:     Effort: Pulmonary effort is normal. No respiratory distress.     Breath sounds: Normal breath sounds. No wheezing or rales.  Chest:     Chest wall: No tenderness.  Abdominal:     General: Bowel sounds are normal. There is no distension.     Palpations: Abdomen is soft. There is no mass.      Tenderness: There is no abdominal tenderness. There is no guarding or rebound.  Genitourinary:    Penis: Normal. No tenderness.      Testes: Normal.     Prostate: Normal.     Rectum: Normal. Guaiac result negative.  Musculoskeletal:        General: No tenderness. Normal range of motion.     Cervical back: Neck supple.  Lymphadenopathy:     Cervical: No cervical adenopathy.  Skin:    General: Skin is warm and dry.     Coloration: Skin is not pale.     Findings: No erythema or rash.  Neurological:     Mental Status: He is alert and oriented to person, place, and time.     Cranial Nerves: No cranial nerve deficit.     Motor: No abnormal muscle tone.     Coordination: Coordination normal.     Deep Tendon Reflexes: Reflexes are normal and symmetric. Reflexes normal.  Psychiatric:        Behavior: Behavior normal.        Thought Content: Thought content normal.        Judgment: Judgment  normal.           Assessment & Plan:  His back pain and BPH are stable. We will get fasting labs including a testosterone level  to follow the hypogonadism, lipids, etc. We wrote for 4 capsules of typhoid vaccine to take prior to his mission trip. We spent a total of (  32 ) minutes reviewing records and discussing these issues.  Alysia Penna, MD

## 2022-08-21 NOTE — Patient Instructions (Addendum)
[name] [name] , Thank you for taking time to come for your Medicare Wellness Visit. I appreciate your ongoing commitment to your health goals. Please review the following plan we discussed and let me know if I can assist you in the future.   These are the goals we discussed:  Goals       Increase physical activity (pt-stated)      Patient states want to do more traveling.        This is a list of the screening recommended for you and due dates:  Health Maintenance  Topic Date Due   Zoster (Shingles) Vaccine (1 of 2) 11/20/2022*   COVID-19 Vaccine (7 - 2023-24 season) 09/18/2023*   Medicare Annual Wellness Visit  08/22/2023   Colon Cancer Screening  04/08/2025   DTaP/Tdap/Td vaccine (2 - Tdap) 08/08/2026   Pneumonia Vaccine  Completed   Flu Shot  Completed   Hepatitis C Screening: USPSTF Recommendation to screen - Ages 18-79 yo.  Completed   HPV Vaccine  Aged Out  *Topic was postponed. The date shown is not the original due date.    Advanced directives: Advance directive discussed with you today. Even though you declined this today, please call our office should you change your mind, and we can give you the proper paperwork for you to fill out.   Conditions/risks identified: None  Next appointment: Follow up in one year for your annual wellness visit.    Preventive Care 7 Years and Older, Male  Preventive care refers to lifestyle choices and visits with your health care provider that can promote health and wellness. What does preventive care include? A yearly physical exam. This is also called an annual well check. Dental exams once or twice a year. Routine eye exams. Ask your health care provider how often you should have your eyes checked. Personal lifestyle choices, including: Daily care of your teeth and gums. Regular physical activity. Eating a healthy diet. Avoiding tobacco and drug use. Limiting alcohol use. Practicing safe sex. Taking low doses of aspirin every  day. Taking vitamin and mineral supplements as recommended by your health care provider. What happens during an annual well check? The services and screenings done by your health care provider during your annual well check will depend on your age, overall health, lifestyle risk factors, and family history of disease. Counseling  Your health care provider may ask you questions about your: Alcohol use. Tobacco use. Drug use. Emotional well-being. Home and relationship well-being. Sexual activity. Eating habits. History of falls. Memory and ability to understand (cognition). Work and work Statistician. Screening  You may have the following tests or measurements: Height, weight, and BMI. Blood pressure. Lipid and cholesterol levels. These may be checked every 5 years, or more frequently if you are over 98 years old. Skin check. Lung cancer screening. You may have this screening every year starting at age 52 if you have a 30-pack-year history of smoking and currently smoke or have quit within the past 15 years. Fecal occult blood test (FOBT) of the stool. You may have this test every year starting at age 77. Flexible sigmoidoscopy or colonoscopy. You may have a sigmoidoscopy every 5 years or a colonoscopy every 10 years starting at age 60. Prostate cancer screening. Recommendations will vary depending on your family history and other risks. Hepatitis C blood test. Hepatitis B blood test. Sexually transmitted disease (STD) testing. Diabetes screening. This is done by checking your blood sugar (glucose) after you have not eaten  for a while (fasting). You may have this done every 1-3 years. Abdominal aortic aneurysm (AAA) screening. You may need this if you are a current or former smoker. Osteoporosis. You may be screened starting at age 66 if you are at high risk. Talk with your health care provider about your test results, treatment options, and if necessary, the need for more  tests. Vaccines  Your health care provider may recommend certain vaccines, such as: Influenza vaccine. This is recommended every year. Tetanus, diphtheria, and acellular pertussis (Tdap, Td) vaccine. You may need a Td booster every 10 years. Zoster vaccine. You may need this after age 66. Pneumococcal 13-valent conjugate (PCV13) vaccine. One dose is recommended after age 33. Pneumococcal polysaccharide (PPSV23) vaccine. One dose is recommended after age 58. Talk to your health care provider about which screenings and vaccines you need and how often you need them. This information is not intended to replace advice given to you by your health care provider. Make sure you discuss any questions you have with your health care provider. Document Released: 08/31/2015 Document Revised: 04/23/2016 Document Reviewed: 06/05/2015 Elsevier Interactive Patient Education  2017 [employer] Prevention in the Home Falls can cause injuries. They can happen to people of all ages. There are many things you can do to make your home safe and to help prevent falls. What can I do on the outside of my home? Regularly fix the edges of walkways and driveways and fix any cracks. Remove anything that might make you trip as you walk through a door, such as a raised step or threshold. Trim any bushes or trees on the path to your home. Use bright outdoor lighting. Clear any walking paths of anything that might make someone trip, such as rocks or tools. Regularly check to see if handrails are loose or broken. Make sure that both sides of any steps have handrails. Any raised decks and porches should have guardrails on the edges. Have any leaves, snow, or ice cleared regularly. Use sand or salt on walking paths during winter. Clean up any spills in your garage right away. This includes oil or grease spills. What can I do in the bathroom? Use night lights. Install grab bars by the toilet and in the tub and shower.  Do not use towel bars as grab bars. Use non-skid mats or decals in the tub or shower. If you need to sit down in the shower, use a plastic, non-slip stool. Keep the floor dry. Clean up any water that spills on the floor as soon as it happens. Remove soap buildup in the tub or shower regularly. Attach bath mats securely with double-sided non-slip rug tape. Do not have throw rugs and other things on the floor that can make you trip. What can I do in the bedroom? Use night lights. Make sure that you have a light by your bed that is easy to reach. Do not use any sheets or blankets that are too big for your bed. They should not hang down onto the floor. Have a firm chair that has side arms. You can use this for support while you get dressed. Do not have throw rugs and other things on the floor that can make you trip. What can I do in the kitchen? Clean up any spills right away. Avoid walking on wet floors. Keep items that you use a lot in easy-to-reach places. If you need to reach something above you, use a strong step stool that has  a grab bar. Keep electrical cords out of the way. Do not use floor polish or wax that makes floors slippery. If you must use wax, use non-skid floor wax. Do not have throw rugs and other things on the floor that can make you trip. What can I do with my stairs? Do not leave any items on the stairs. Make sure that there are handrails on both sides of the stairs and use them. Fix handrails that are broken or loose. Make sure that handrails are as long as the stairways. Check any carpeting to make sure that it is firmly attached to the stairs. Fix any carpet that is loose or worn. Avoid having throw rugs at the top or bottom of the stairs. If you do have throw rugs, attach them to the floor with carpet tape. Make sure that you have a light switch at the top of the stairs and the bottom of the stairs. If you do not have them, ask someone to add them for you. What else  can I do to help prevent falls? Wear shoes that: Do not have high heels. Have rubber bottoms. Are comfortable and fit you well. Are closed at the toe. Do not wear sandals. If you use a stepladder: Make sure that it is fully opened. Do not climb a closed stepladder. Make sure that both sides of the stepladder are locked into place. Ask someone to hold it for you, if possible. Clearly mark and make sure that you can see: Any grab bars or handrails. First and last steps. Where the edge of each step is. Use tools that help you move around (mobility aids) if they are needed. These include: Canes. Walkers. Scooters. Crutches. Turn on the lights when you go into a dark area. Replace any light bulbs as soon as they burn out. Set up your furniture so you have a clear path. Avoid moving your furniture around. If any of your floors are uneven, fix them. If there are any pets around you, be aware of where they are. Review your medicines with your doctor. Some medicines can make you feel dizzy. This can increase your chance of falling. Ask your doctor what other things that you can do to help prevent falls. This information is not intended to replace advice given to you by your health care provider. Make sure you discuss any questions you have with your health care provider. Document Released: 05/31/2009 Document Revised: 01/10/2016 Document Reviewed: 09/08/2014 Elsevier Interactive Patient Education  2017 [employer].

## 2022-08-21 NOTE — Progress Notes (Signed)
Subjective:   [name] [name] is a 67 y.o. male who presents for Medicare Annual/Subsequent preventive examination.  Review of Systems    Virtual Visit via Telephone Note  I connected with  [name] [name] on 08/21/22 at 10:30 AM EST by telephone and verified that I am speaking with the correct person using two identifiers.  Location: Patient: Home Provider: Office Persons participating in the virtual visit: patient/Nurse Health Advisor   I discussed the limitations, risks, security and privacy concerns of performing an evaluation and management service by telephone and the availability of in person appointments. The patient expressed understanding and agreed to proceed.  Interactive audio and video telecommunications were attempted between this nurse and patient, however failed, due to patient having technical difficulties OR patient did not have access to video capability.  We continued and completed visit with audio only.  Some vital signs may be absent or patient reported.   Criselda Peaches, LPN  Cardiac Risk Factors include: advanced age (>57mn, >>24women);male gender     Objective:    Today's Vitals   08/21/22 1031  Weight: 188 lb (85.3 kg)  Height: 5' 7.5" (1.715 m)   Body mass index is 29.01 kg/m.     08/21/2022   10:38 AM 08/15/2021   10:40 AM 03/26/2015    1:27 PM  Advanced Directives  Does Patient Have a Medical Advance Directive? No No No  Would patient like information on creating a medical advance directive? No - Patient declined No - Patient declined No - patient declined information    Current Medications (verified) Outpatient Encounter Medications as of 08/21/2022  Medication Sig   AMBULATORY NON FORMULARY MEDICATION 20 mg 2 (two) times daily. CBD oil   diclofenac Sodium (PENNSAID) 2 % SOLN Place 2 Pump (40 mg total) onto the skin 2 (two) times daily.   sildenafil (VIAGRA) 100 MG tablet Take 1 tablet (100 mg total) by mouth as needed for erectile  dysfunction.   Testosterone 25 MG/2.5GM (1%) GEL Apply 2.5 g topically daily.   triamcinolone (NASACORT) 55 MCG/ACT AERO nasal inhaler    [DISCONTINUED] sildenafil (VIAGRA) 100 MG tablet Take 1 tablet (100 mg total) by mouth as needed for erectile dysfunction.   [DISCONTINUED] Testosterone 25 MG/2.5GM (1%) GEL Apply 2.5 g topically daily.   No facility-administered encounter medications on file as of 08/21/2022.    Allergies (verified) Amoxicillin-pot clavulanate   History: Past Medical History:  Diagnosis Date   Basal cell carcinoma (BCC) of upper lip 09/2018   Basal cell carcinoma of face    5 of the face   CARCINOMA, BASAL CELL, RECURRENT 09/07/2008   [facility]/07[facility]2008   DIVERTICULOSIS, COLON 11/03/2007   LOW BACK PAIN, CHRONIC 12/18/2008   Past Surgical History:  Procedure Laterality Date   BASAL CELL CARCINOMA EXCISION  07/30/2015   temporal left side, back of head right side   COLONOSCOPY  04/09/2015   per Dr. GCarlean Purl sigmoid diverticula, no polyps, repeat in 10 yrs   Exploration for Undescended testicle      age 67  fractured patella     teenager   [place] 89  [place] 89(concomittently with exploratory surgery)   TONSILLECTOMY AND ADENOIDECTOMY     age 67  Family History  Problem Relation Age of Onset   Hyperlipidemia Mother  Hypertension Father    Atrial fibrillation Father    Obesity Father    Heart disease Father        a. fib   Cancer Maternal Grandmother        Uterine   Cancer Paternal Grandmother        Breast   Stroke Paternal Grandmother    Hyperlipidemia Other    Diabetes Other    Colon cancer Neg Hx    Colon polyps Neg Hx    Social History   Socioeconomic History   Marital status: Married    Spouse name: Not on file   Number of children: Not on file   Years of education: Not on file   Highest education level:  Not on file  Occupational History   Occupation: [employer]  Tobacco Use   Smoking status: Former    Types: Cigarettes    Quit date: 04/09/1977    Years since quitting: 45.3   Smokeless tobacco: Never  Vaping Use   Vaping Use: Never used  Substance and Sexual Activity   Alcohol use: Yes    Alcohol/week: 12.0 standard drinks of alcohol    Types: 12 Standard drinks or equivalent per week    Comment: moderate--2 beers daily   Drug use: No   Sexual activity: Yes    Partners: Female  Other Topics Concern   Not on file  Social History Narrative   [place]; MBA [employer]. Married '79. 1 daughter -'37 (nurse), 1 son '88. [place] [employer] [employer]. 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 Marriage in good health      Some beer drinking, former smoker no drug use   Social Determinants of Health   Financial Resource Strain: Low Risk  (08/21/2022)   Overall Financial Resource Strain (CARDIA)    Difficulty of Paying Living Expenses: Not hard at all  Food Insecurity: No Food Insecurity (08/21/2022)   Hunger Vital Sign    Worried About Running Out of Food in the Last Year: Never true    Ran Out of Food in the Last Year: Never true  Transportation Needs: No Transportation Needs (08/21/2022)   PRAPARE - Hydrologist (Medical): No    Lack of Transportation (Non-Medical): No  Physical Activity: Sufficiently Active (08/21/2022)   Exercise Vital Sign    Days of Exercise per Week: 7 days    Minutes of Exercise per Session: 30 min  Stress: No Stress Concern Present (08/21/2022)   [place]    Feeling of Stress : Not at all  Social Connections: [place] (08/21/2022)   Social Connection and Isolation Panel [NHANES]    Frequency of Communication with Friends and Family: More than three times a week    Frequency of Social  Gatherings with Friends and Family: More than three times a week    Attends Religious Services: More than 4 times per year    Active Member of [employer] or Organizations: Yes    Attends Music therapist: More than 4 times per year    Marital Status: Married    Tobacco Counseling Counseling given: Not Answered   Clinical Intake:  Pre-visit preparation completed: No  Pain : No/denies pain     BMI - recorded: 29.01 Nutritional Status: BMI 25 -29 Overweight Nutritional Risks: None Diabetes: No  How often do you need to have someone help you when you read instructions, pamphlets,  or other written materials from your doctor or pharmacy?: 1 - Never  Diabetic? No  Interpreter Needed?: No  Information entered by :: Rolene Arbour LPN   Activities of Daily Living    08/21/2022   10:36 AM  In your present state of health, do you have any difficulty performing the following activities:  Hearing? 1  Comment Wears hearing aids  Vision? 0  Difficulty concentrating or making decisions? 0  Walking or climbing stairs? 0  Dressing or bathing? 0  Doing errands, shopping? 0  Preparing Food and eating ? N  Using the Toilet? N  In the past six months, have you accidently leaked urine? N  Do you have problems with loss of bowel control? N  Managing your Medications? N  Managing your Finances? N  Housekeeping or managing your Housekeeping? N    Patient Care Team: Laurey Morale, MD as PCP - General (Family Medicine)  Indicate any recent Medical Services you may have received from other than [facility] providers in the past year (date may be approximate).     Assessment:   This is a routine wellness examination for [place].  Hearing/Vision screen Hearing Screening - Comments:: Wears hearing aids Vision Screening - Comments:: Wears rx glasses - up to date with routine eye exams with  [place] issues and exercise activities discussed: Current Exercise Habits: Home  exercise routine, Type of exercise: walking, Time (Minutes): 30, Frequency (Times/Week): 7, Weekly Exercise (Minutes/Week): 210, Intensity: Moderate, Exercise limited by: None identified   Goals Addressed               This Visit's Progress     Increase physical activity (pt-stated)        Patient states want to do more traveling.       Depression Screen    08/21/2022   10:34 AM 08/15/2021   10:31 AM 07/04/2021    9:09 AM 05/09/2021   11:27 AM 07/03/2020    9:42 AM 05/26/2019   10:35 AM 04/06/2018   12:23 PM  PHQ 2/9 Scores  PHQ - 2 Score 0 0 0 0 0 0 0  PHQ- 9 Score  0 1 1       Fall Risk    08/21/2022   10:36 AM 08/15/2021   10:37 AM 08/15/2021    9:32 AM 07/04/2021    8:16 AM 07/03/2020    9:42 AM  Fall Risk   Falls in the past year? 0 0 0 0 0  Number falls in past yr: 0 0  0   Injury with Fall? 0 0  0   Risk for fall due to : No Fall Risks   No Fall Risks   Follow up Falls prevention discussed        FALL RISK PREVENTION PERTAINING TO THE HOME:  Any stairs in or around the home? Yes  If so, are there any without handrails? No  Home free of loose throw rugs in walkways, pet beds, electrical cords, etc? Yes  Adequate lighting in your home to reduce risk of falls? Yes   ASSISTIVE DEVICES UTILIZED TO PREVENT FALLS:  Life alert? No  Use of a cane, walker or w/c? No  Grab bars in the bathroom? No  Shower chair or bench in shower? No  Elevated toilet seat or a handicapped toilet? No   TIMED UP AND GO:  Was the test performed? No . Audio Visit   Cognitive Function:  08/21/2022   10:38 AM 08/15/2021   10:38 AM  6CIT Screen  What Year? 0 points 0 points  What month? 0 points 0 points  What time? 0 points 0 points  Count back from 20 0 points 0 points  Months in reverse 0 points 0 points  Repeat phrase 0 points 0 points  Total Score 0 points 0 points    Immunizations Immunization History  Administered Date(s) Administered   Fluad Quad(high  Dose 65+) 05/05/2022   Hepatitis A, Adult 08/06/2016, 04/28/2017   Hepatitis B 04/01/1994, 05/06/1994, 10/10/1994   Influenza Whole 06/17/2012   Influenza, High Dose Seasonal PF 05/31/2020   Influenza,inj,Quad PF,6+ Mos 05/19/2016, 05/19/2019   Influenza-Unspecified 04/25/2015, 05/27/2017, 05/26/2018, 05/27/2021   MMR 04/27/1995   Moderna Covid-19 Vaccine Bivalent Booster 29yr & up 05/02/2021, 05/09/2022   Moderna Sars-Covid-2 Vaccination 09/21/2019, 10/19/2019, 11/21/2019, 06/19/2020   Pneumococcal Conjugate-13 07/03/2020   Pneumococcal Polysaccharide-23 07/04/2021   Respiratory Syncytial Virus Vaccine,Recomb Aduvanted(Arexvy) 06/11/2022   Td 08/08/2016   Zoster, Live 12/24/2015    TDAP status: Up to date  Flu Vaccine status: Up to date  Pneumococcal vaccine status: Up to date  Covid-19 vaccine status: Completed vaccines  Qualifies for Shingles Vaccine? Yes   Zostavax completed No   Shingrix Completed?: No.    Education has been provided regarding the importance of this vaccine. Patient has been advised to call insurance company to determine out of pocket expense if they have not yet received this vaccine. Advised may also receive vaccine at local pharmacy or Health Dept. Verbalized acceptance and understanding.  Screening Tests Health Maintenance  Topic Date Due   Zoster Vaccines- Shingrix (1 of 2) 11/20/2022 (Originally 08/29/1974)   COVID-19 Vaccine (7 - 2023-24 season) 09/18/2023 (Originally 07/04/2022)   Medicare Annual Wellness (AWV)  08/22/2023   COLONOSCOPY (Pts 45-414yrInsurance coverage will need to be confirmed)  04/08/2025   DTaP/Tdap/Td (2 - Tdap) 08/08/2026   Pneumonia Vaccine 6562Years old  Completed   INFLUENZA VACCINE  Completed   Hepatitis C Screening  Completed   HPV VACCINES  Aged Out    Health Maintenance  There are no preventive care reminders to display for this patient.   Colorectal cancer screening: Type of screening: Colonoscopy. Completed  04/09/15. Repeat every 10 years  Lung Cancer Screening: (Low Dose CT Chest recommended if Age 360-80ears, 30 pack-year currently smoking OR have quit w/in 15years.) does not qualify.     Additional Screening:  Hepatitis C Screening: does qualify; Completed 12/17/15  Vision Screening: Recommended annual ophthalmology exams for early detection of glaucoma and other disorders of the eye. Is the patient up to date with their annual eye exam?  Yes  Who is the provider or what is the name of the office in which the patient attends annual eye exams? [facility] with [facility] provider, would they like to be referred to a provider to establish care? No .   Dental Screening: Recommended annual dental exams for proper oral hygiene  Community Resource Referral / Chronic Care Management:  CRR required this visit?  No   CCM required this visit?  No      Plan:     I have personally reviewed and noted the following in the patient's chart:   Medical and social history Use of alcohol, tobacco or illicit drugs  Current medications and supplements including opioid prescriptions. Patient is not currently taking opioid prescriptions. Functional ability and status Nutritional status Physical  activity Advanced directives List of other physicians Hospitalizations, surgeries, and ER visits in previous 12 months Vitals Screenings to include cognitive, depression, and falls Referrals and appointments  In addition, I have reviewed and discussed with patient certain preventive protocols, quality metrics, and best practice recommendations. A written personalized care plan for preventive services as well as general preventive health recommendations were provided to patient.     Criselda Peaches, LPN   0/04/2003   Nurse Notes: None

## 2022-08-26 ENCOUNTER — Other Ambulatory Visit: Payer: Self-pay

## 2022-08-26 DIAGNOSIS — E785 Hyperlipidemia, unspecified: Secondary | ICD-10-CM

## 2022-08-26 MED ORDER — ATORVASTATIN CALCIUM 10 MG PO TABS: 10.0000 mg | ORAL_TABLET | Freq: Every day | ORAL | 3 refills | Status: DC

## 2022-09-22 ENCOUNTER — Encounter: Payer: Self-pay | Admitting: Family Medicine

## 2022-09-22 ENCOUNTER — Ambulatory Visit (INDEPENDENT_AMBULATORY_CARE_PROVIDER_SITE_OTHER)
Admission: RE | Admit: 2022-09-22 | Discharge: 2022-09-22 | Disposition: A | Discharge status: Home / Self Care | Payer: Medicare HMO | Source: Ambulatory Visit | Attending: Family Medicine | Admitting: Family Medicine

## 2022-09-22 ENCOUNTER — Ambulatory Visit: Payer: Medicare HMO | Admitting: Family Medicine

## 2022-09-22 VITALS — BP 114/80 | HR 60 | Ht 67.5 in | Wt 189.4 lb

## 2022-09-22 DIAGNOSIS — M5416 Radiculopathy, lumbar region: Secondary | ICD-10-CM

## 2022-09-22 NOTE — Progress Notes (Signed)
   I, [name] [name], LAT, ATC acting as a scribe for [name] Leader, MD.  [name] [name] is a 67 y.o. male who presents to [place] at [facility] today for R leg pain. Pt was previously seen by Dr. Georgina [name] on 04/15/22 for cervical radiculopathy. Today, pt c/o R leg pain since November intermittently.  Pt locates pain to lateral thigh radiating to the lateral calf.  Low back pain: Sx off and on since Thanksgiving, after [place] from [place].  Radiating pain: Radicular sx have developed over the past couple of weeks, sharp shooting/aching.  LE numbness/tingling: denies n/t, weakness in the LE.  Aggravates: yard work/farm work Treatments tried: Some improvement with Pennsaid, was able to sleep last night. Shooting pain resolved after using Pennsaid last night before bed.  Sx at anterolateral aspect of the thigh. Some discomfort in the gluteal region, denies groin pain.   Dx imaging: 12/21/08 L-spine XR  Pertinent review of systems: No fevers or chills  Relevant historical information: History of cervical radiculopathy in the past.   Exam:  BP 114/80   Pulse 60   Ht 5' 7.5" (1.715 m)   Wt 189 lb 6.4 oz (85.9 kg)   SpO2 98%   BMI 29.23 kg/m  General: Well Developed, well nourished, and in no acute distress.   MSK: L-spine: Normal. Nontender palpation spinal midline. Decreased lumbar motion. Lower extremity strength is intact. Reflexes are intact. Negative slump and straight leg raise test.     Lab and Radiology Results  X-ray images L-spine obtained today personally and independently interpreted DDD L5-S1.  DDD also present at L2-3.  No acute fractures are visible. Await formal radiology over read    Assessment and Plan: 68 y.o. male with right lateral thigh pain occasionally radiating below the thigh into the lateral calf. Pain is worse with lumbar flexion and better with extension.  Symptoms thought to be lumbar radiculopathy mostly L5  dermatomal pattern.  Plan for course of prednisone trial of gabapentin.  Here he has both medicines leftover from a prior episode of cervical radiculopathy last year that he never took.  Additionally we will proceed to physical therapy.  He lives near [place] so we will use a physical therapy location there.  Recheck in about 6 weeks.  If worsening or not improving neck step should be MRI lumbar spine.   PDMP not reviewed this encounter. Orders Placed This Encounter  Procedures   DG Lumbar Spine 2-3 Views    Standing Status:   Future    Number of Occurrences:   1    Standing Expiration Date:   09/23/2023    Order Specific Question:   Reason for Exam (SYMPTOM  OR DIAGNOSIS REQUIRED)    Answer:   lumbar radiculopathy    Order Specific Question:   Preferred imaging location?    Answer:   [facility]   Ambulatory referral to Physical Therapy    Referral Priority:   Routine    Referral Type:   Physical Medicine    Referral Reason:   Specialty Services Required    Requested Specialty:   Physical Therapy    Number of Visits Requested:   1   No orders of the defined types were placed in this encounter.    Discussed warning signs or symptoms. Please see discharge instructions. Patient expresses understanding.   The above documentation has been reviewed and is accurate and complete [name] [name], M.D.

## 2022-09-22 NOTE — Patient Instructions (Signed)
Thank you for coming in today.   Please get an Xray today before you leave   I've referred you to Physical Therapy.  Let us know if you don't hear from them in one week.   If not improving in 6 weeks next step is MRI.   Take the prednisone now.   Use gabapentin as a backup.   If worse let me know.

## 2022-09-23 NOTE — Progress Notes (Signed)
Low back x-ray shows some arthritis. If not better an MRI would show a pinched nerve more accurately.

## 2022-11-26 ENCOUNTER — Other Ambulatory Visit: Payer: Medicare HMO

## 2023-02-16 ENCOUNTER — Telehealth: Payer: Self-pay | Admitting: Family Medicine

## 2023-02-16 DIAGNOSIS — E785 Hyperlipidemia, unspecified: Secondary | ICD-10-CM

## 2023-02-16 DIAGNOSIS — Z209 Contact with and (suspected) exposure to unspecified communicable disease: Secondary | ICD-10-CM

## 2023-02-16 DIAGNOSIS — E291 Testicular hypofunction: Secondary | ICD-10-CM

## 2023-02-16 NOTE — Telephone Encounter (Signed)
Thew lab orders are in

## 2023-02-16 NOTE — Telephone Encounter (Signed)
Spoke with pt stated that he wants the above labs done with complete STD/STI testings. Pt has a lab appointment for 02/18/23

## 2023-02-16 NOTE — Telephone Encounter (Signed)
Pt would like the following labs on Wednesday, 02/18/23: Lipid panel  Testosterone check  STI check   Please advise.

## 2023-02-18 ENCOUNTER — Other Ambulatory Visit (INDEPENDENT_AMBULATORY_CARE_PROVIDER_SITE_OTHER): Payer: Medicare HMO

## 2023-02-18 DIAGNOSIS — Z209 Contact with and (suspected) exposure to unspecified communicable disease: Secondary | ICD-10-CM

## 2023-02-18 DIAGNOSIS — E291 Testicular hypofunction: Secondary | ICD-10-CM

## 2023-02-18 DIAGNOSIS — E785 Hyperlipidemia, unspecified: Secondary | ICD-10-CM | POA: Diagnosis not present

## 2023-02-18 LAB — TESTOSTERONE: Testosterone: 309.73 ng/dL (ref 300.00–890.00)

## 2023-02-18 LAB — LIPID PANEL
Cholesterol: 177 mg/dL (ref 0–200)
HDL: 67.5 mg/dL (ref >39.00)
LDL Cholesterol: 94 mg/dL (ref 0–99)
NonHDL: 109.58
Total CHOL/HDL Ratio: 3
Triglycerides: 78 mg/dL (ref 0.0–149.0)
VLDL: 15.6 mg/dL (ref 0.0–40.0)

## 2023-02-20 LAB — HIV ANTIBODY (ROUTINE TESTING W REFLEX): HIV 1&2 Ab, 4th Generation: NONREACTIVE

## 2023-02-20 LAB — RPR: RPR Ser Ql: NONREACTIVE

## 2023-02-25 ENCOUNTER — Other Ambulatory Visit: Payer: Self-pay | Admitting: Family Medicine

## 2023-02-25 ENCOUNTER — Ambulatory Visit (INDEPENDENT_AMBULATORY_CARE_PROVIDER_SITE_OTHER): Payer: Medicare HMO | Admitting: Family Medicine

## 2023-02-25 VITALS — BP 120/78 | HR 55 | Temp 98.2°F | Wt 189.0 lb

## 2023-02-25 DIAGNOSIS — L739 Follicular disorder, unspecified: Secondary | ICD-10-CM

## 2023-02-25 MED ORDER — DOXYCYCLINE HYCLATE 100 MG PO CAPS: 100.0000 mg | ORAL_CAPSULE | Freq: Two times a day (BID) | ORAL | 0 refills | Status: AC

## 2023-02-25 MED ORDER — TRIAMCINOLONE ACETONIDE 0.1 % EX CREA: 1.0000 | TOPICAL_CREAM | Freq: Two times a day (BID) | CUTANEOUS | 2 refills | Status: DC

## 2023-02-25 NOTE — Progress Notes (Signed)
   Subjective:    Patient ID: [name] [name], male    DOB: 1956/01/07, 67 y.o.   MRN: [id number]  HPI Here for 2 weeks of an itchy rash on his chest. He thinks this came from working outside in sweaty shirts. He has tried applying Clotrimazole cream and Nystatin ointment with no relief.    Review of Systems  Constitutional: Negative.   Respiratory: Negative.    Cardiovascular: Negative.   Skin:  Positive for rash.       Objective:   Physical Exam Constitutional:      Appearance: Normal appearance.  Cardiovascular:     Rate and Rhythm: Normal rate and regular rhythm.     Pulses: Normal pulses.     Heart sounds: Normal heart sounds.  Pulmonary:     Effort: Pulmonary effort is normal.     Breath sounds: Normal breath sounds.  Skin:    Comments: There is an area over the sternum and beneath both breasts with scattered red macules and papules   Neurological:     Mental Status: He is alert.           Assessment & Plan:  Folliculitis. Treat with 10 days of Doxycycline and he will apply Triamcinolone cream as needed.  Gershon Crane, MD

## 2023-02-26 ENCOUNTER — Other Ambulatory Visit: Payer: Self-pay

## 2023-02-26 ENCOUNTER — Telehealth: Payer: Self-pay | Admitting: Family Medicine

## 2023-02-26 MED ORDER — ATORVASTATIN CALCIUM 10 MG PO TABS: 10.0000 mg | ORAL_TABLET | Freq: Every day | ORAL | 3 refills | Status: DC

## 2023-02-26 NOTE — Telephone Encounter (Signed)
Pt Rx sent as requested 

## 2023-02-26 NOTE — Telephone Encounter (Signed)
Prescription Request  02/26/2023  LOV: 02/25/2023  What is the name of the medication or equipment? atorvastatin (LIPITOR) 10 MG tablet   Have you contacted your pharmacy to request a refill? No   Which pharmacy would you like this sent to?   [facility] [address] [place] - [address] [place] [address] Phone: [phone] Fax: [phone]     Patient notified that their request is being sent to the clinical staff for review and that they should receive a response within 2 business days.   Please advise at Mobile [phone] (mobile)

## 2023-02-26 NOTE — Telephone Encounter (Signed)
Pt LOV was on 08/21/22 Last refill was done on 08/21/22 Please advise

## 2023-03-20 ENCOUNTER — Encounter: Payer: Self-pay | Admitting: Family Medicine

## 2023-03-20 ENCOUNTER — Ambulatory Visit (INDEPENDENT_AMBULATORY_CARE_PROVIDER_SITE_OTHER): Payer: Medicare HMO | Admitting: Family Medicine

## 2023-03-20 VITALS — BP 106/66 | HR 58 | Temp 98.2°F | Ht 67.5 in | Wt 187.0 lb

## 2023-03-20 DIAGNOSIS — L739 Follicular disorder, unspecified: Secondary | ICD-10-CM | POA: Diagnosis not present

## 2023-03-20 MED ORDER — DOXYCYCLINE HYCLATE 100 MG PO CAPS: 100.0000 mg | ORAL_CAPSULE | Freq: Two times a day (BID) | ORAL | 3 refills | Status: DC

## 2023-03-20 NOTE — Progress Notes (Signed)
   Subjective:    Patient ID: [name] [name], male    DOB: [birth year], 67 y.o.   MRN: [id number]  HPI Here for several rashes. We saw him 3 weeks ago for an itchy rash on his chest which we diagnosed as folliculitis. He took 10 days of Doxycycline and applied Triamcinolone crea with good results. Then he went on another trip to [place], and the chest rash came back. In addition he developed a similar rash on his neck and chin.    Review of Systems  Constitutional: Negative.   Respiratory: Negative.    Cardiovascular: Negative.   Skin:  Positive for rash.       Objective:   Physical Exam Constitutional:      Appearance: Normal appearance.  Cardiovascular:     Rate and Rhythm: Normal rate and regular rhythm.     Pulses: Normal pulses.     Heart sounds: Normal heart sounds.  Pulmonary:     Effort: Pulmonary effort is normal.     Breath sounds: Normal breath sounds.  Skin:    Comments: Scattered pink follicular rash on the chin, anterior neck, and chest   Neurological:     Mental Status: He is alert.           Assessment & Plan:  Folliculitis. We will plan to treat this for a longer period of time. We wrote for a 90 day course of Doxycycline with refills. Recheck as needed.  Gershon Crane, MD

## 2023-04-15 ENCOUNTER — Encounter: Payer: Self-pay | Admitting: Family Medicine

## 2023-06-05 ENCOUNTER — Encounter: Payer: Self-pay | Admitting: Family Medicine

## 2023-08-24 ENCOUNTER — Encounter: Payer: Medicare HMO | Admitting: Family Medicine

## 2023-08-28 ENCOUNTER — Ambulatory Visit (INDEPENDENT_AMBULATORY_CARE_PROVIDER_SITE_OTHER): Payer: Medicare HMO | Admitting: Family Medicine

## 2023-08-28 VITALS — BP 110/70 | HR 52 | Temp 97.7°F | Ht 69.0 in | Wt 188.0 lb

## 2023-08-28 DIAGNOSIS — N401 Enlarged prostate with lower urinary tract symptoms: Secondary | ICD-10-CM | POA: Diagnosis not present

## 2023-08-28 DIAGNOSIS — R739 Hyperglycemia, unspecified: Secondary | ICD-10-CM | POA: Diagnosis not present

## 2023-08-28 DIAGNOSIS — N138 Other obstructive and reflux uropathy: Secondary | ICD-10-CM

## 2023-08-28 DIAGNOSIS — E785 Hyperlipidemia, unspecified: Secondary | ICD-10-CM | POA: Diagnosis not present

## 2023-08-28 DIAGNOSIS — M51379 Other intervertebral disc degeneration, lumbosacral region without mention of lumbar back pain or lower extremity pain: Secondary | ICD-10-CM | POA: Diagnosis not present

## 2023-08-28 DIAGNOSIS — E291 Testicular hypofunction: Secondary | ICD-10-CM

## 2023-08-28 DIAGNOSIS — Z209 Contact with and (suspected) exposure to unspecified communicable disease: Secondary | ICD-10-CM

## 2023-08-28 LAB — CBC WITH DIFFERENTIAL/PLATELET
Basophils Absolute: 0 10*3/uL (ref 0.0–0.1)
Basophils Relative: 0.3 % (ref 0.0–3.0)
Eosinophils Absolute: 0 10*3/uL (ref 0.0–0.7)
Eosinophils Relative: 0.9 % (ref 0.0–5.0)
HCT: 41.5 % (ref 39.0–52.0)
Hemoglobin: 13.9 g/dL (ref 13.0–17.0)
Lymphocytes Relative: 40.1 % (ref 12.0–46.0)
Lymphs Abs: 1.2 10*3/uL (ref 0.7–4.0)
MCHC: 33.6 g/dL (ref 30.0–36.0)
MCV: 96 fL (ref 78.0–100.0)
Monocytes Absolute: 0.3 10*3/uL (ref 0.1–1.0)
Monocytes Relative: 10.9 % (ref 3.0–12.0)
Neutro Abs: 1.5 10*3/uL (ref 1.4–7.7)
Neutrophils Relative %: 47.8 % (ref 43.0–77.0)
Platelets: 163 10*3/uL (ref 150.0–400.0)
RBC: 4.32 Mil/uL (ref 4.22–5.81)
RDW: 13 % (ref 11.5–15.5)
WBC: 3.1 10*3/uL — ABNORMAL LOW (ref 4.0–10.5)

## 2023-08-28 LAB — HEPATIC FUNCTION PANEL
ALT: 13 U/L (ref 0–53)
AST: 25 U/L (ref 0–37)
Albumin: 4.8 g/dL (ref 3.5–5.2)
Alkaline Phosphatase: 47 U/L (ref 39–117)
Bilirubin, Direct: 0.2 mg/dL (ref 0.0–0.3)
Total Bilirubin: 0.8 mg/dL (ref 0.2–1.2)
Total Protein: 6.9 g/dL (ref 6.0–8.3)

## 2023-08-28 LAB — BASIC METABOLIC PANEL
BUN: 15 mg/dL (ref 6–23)
CO2: 30 meq/L (ref 19–32)
Calcium: 9.4 mg/dL (ref 8.4–10.5)
Chloride: 104 meq/L (ref 96–112)
Creatinine, Ser: 1.03 mg/dL (ref 0.40–1.50)
GFR: 74.39 mL/min (ref >60.00)
Glucose, Bld: 95 mg/dL (ref 70–99)
Potassium: 4.4 meq/L (ref 3.5–5.1)
Sodium: 141 meq/L (ref 135–145)

## 2023-08-28 LAB — TESTOSTERONE: Testosterone: 337.66 ng/dL (ref 300.00–890.00)

## 2023-08-28 LAB — TSH: TSH: 1.02 u[IU]/mL (ref 0.35–5.50)

## 2023-08-28 LAB — LIPID PANEL
Cholesterol: 173 mg/dL (ref 0–200)
HDL: 69.6 mg/dL (ref >39.00)
LDL Cholesterol: 88 mg/dL (ref 0–99)
NonHDL: 103.71
Total CHOL/HDL Ratio: 2
Triglycerides: 77 mg/dL (ref 0.0–149.0)
VLDL: 15.4 mg/dL (ref 0.0–40.0)

## 2023-08-28 LAB — PSA: PSA: 1.42 ng/mL (ref 0.10–4.00)

## 2023-08-28 LAB — HEMOGLOBIN A1C: Hgb A1c MFr Bld: 5.8 % (ref 4.6–6.5)

## 2023-08-28 MED ORDER — TESTOSTERONE 25 MG/2.5GM (1%) TD GEL: 2.0000 | Freq: Every day | TRANSDERMAL | 1 refills | Status: DC

## 2023-08-28 NOTE — Progress Notes (Signed)
 Subjective:    Patient ID: [name] [name], male    DOB: 11-28-1955, 68 y.o.   MRN: [id number]  HPI Here to follow up on issues. He feels well in general. He has been applying 1/2 packet of testosterone  gel to each arm every day, but he wants to increase this. His low back pain is well controlled. He has been urinating easily.    Review of Systems  Constitutional: Negative.   HENT: Negative.    Eyes: Negative.   Respiratory: Negative.    Cardiovascular: Negative.   Gastrointestinal: Negative.   Genitourinary: Negative.   Musculoskeletal: Negative.   Skin: Negative.   Neurological: Negative.   Psychiatric/Behavioral: Negative.         Objective:   Physical Exam Constitutional:      General: He is not in acute distress.    Appearance: Normal appearance. He is well-developed. He is not diaphoretic.  HENT:     Head: Normocephalic and atraumatic.     Right Ear: External ear normal.     Left Ear: External ear normal.     Nose: Nose normal.     Mouth/Throat:     Pharynx: No oropharyngeal exudate.  Eyes:     General: No scleral icterus.       Right eye: No discharge.        Left eye: No discharge.     Conjunctiva/sclera: Conjunctivae normal.     Pupils: Pupils are equal, round, and reactive to light.  Neck:     Thyroid : No thyromegaly.     Vascular: No JVD.     Trachea: No tracheal deviation.  Cardiovascular:     Rate and Rhythm: Normal rate and regular rhythm.     Pulses: Normal pulses.     Heart sounds: Normal heart sounds. No murmur heard.    No friction rub. No gallop.  Pulmonary:     Effort: Pulmonary effort is normal. No respiratory distress.     Breath sounds: Normal breath sounds. No wheezing or rales.  Chest:     Chest wall: No tenderness.  Abdominal:     General: Bowel sounds are normal. There is no distension.     Palpations: Abdomen is soft. There is no mass.     Tenderness: There is no abdominal tenderness. There is no guarding or rebound.   Genitourinary:    Penis: Normal. No tenderness.      Testes: Normal.     Prostate: Normal.     Rectum: Normal. Guaiac result negative.  Musculoskeletal:        General: No tenderness. Normal range of motion.     Cervical back: Neck supple.  Lymphadenopathy:     Cervical: No cervical adenopathy.  Skin:    General: Skin is warm and dry.     Coloration: Skin is not pale.     Findings: No erythema or rash.  Neurological:     General: No focal deficit present.     Mental Status: He is alert and oriented to person, place, and time.     Cranial Nerves: No cranial nerve deficit.     Motor: No abnormal muscle tone.     Coordination: Coordination normal.     Deep Tendon Reflexes: Reflexes are normal and symmetric. Reflexes normal.  Psychiatric:        Mood and Affect: Mood normal.        Behavior: Behavior normal.        Thought Content: Thought content normal.  Judgment: Judgment normal.           Assessment & Plan:  He is doing well overall. His low back pain and BPH are stable. We will get fasting labs today to check lipids, a testosterone  level, etc. For the hypogonadism, we will increase the testosterone  gel to a full packet on each arm daily. We spent a total of ( 33  ) minutes reviewing records and discussing these issues.  Garnette Olmsted, MD

## 2023-08-30 LAB — C. TRACHOMATIS/N. GONORRHOEAE RNA
C. trachomatis RNA, TMA: NOT DETECTED
N. gonorrhoeae RNA, TMA: NOT DETECTED

## 2023-10-13 ENCOUNTER — Ambulatory Visit (INDEPENDENT_AMBULATORY_CARE_PROVIDER_SITE_OTHER): Payer: Medicare HMO | Admitting: Family Medicine

## 2023-10-13 DIAGNOSIS — Z0000 Encounter for general adult medical examination without abnormal findings: Secondary | ICD-10-CM | POA: Diagnosis not present

## 2023-10-13 NOTE — Progress Notes (Signed)
 PATIENT CHECK-IN and HEALTH RISK ASSESSMENT QUESTIONNAIRE:  -completed by phone/video for upcoming Medicare Preventive Visit  -Please select "NOT IN PERSON" for method of visit.   Pre-Visit Check-in: 1)Vitals (height, wt, BP, etc) - record in vitals section for visit on day of visit Request home vitals (wt, BP, etc.) and enter into vitals, THEN update Vital Signs SmartPhrase below at the top of the HPI. See below.  2)Review and Update Medications, Allergies PMH, Surgeries, Social history in Epic 3)Hospitalizations in the last year with date/reason?  no  4)Review and Update Care Team (patient's specialists) in Epic 5) Complete PHQ9 in Epic  6) Complete Fall Screening in Epic 7)Review all Health Maintenance Due and order under PCP if not done.  Medicare Wellness Patient Questionnaire:  Answer theses question about your habits: How often do you have a drink containing alcohol? 2-3 times a week How many drinks containing alcohol do you have on a typical day when you are drinking? 3-4 drinks How often do you have six or more drinks on one occasion? About once a month or less, light beer with friends Have you ever smoked? Smoke as a teenager and in his 82s, not a heavy smoker Do you use smokeless tobacco? n Do you use an illicit drugs?n On average, how many days per week do you engage in moderate to strenuous exercise (like a brisk walk)? 5 days per week On average, how many minutes do you engage in exercise at this level? 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 Do you have any difficulty performing your everyday activities?n Are you having any difficulty walking, taking medications on your own, and or difficulty managing daily home needs?n Do you have difficulty walking or climbing stairs?n Do you have difficulty dressing or bathing?n Do you have difficulty doing errands alone such as visiting a doctor's office or shopping?n Do you currently have any difficulty preparing food and eating?n Do you currently have any difficulty using the toilet?n Do you have any difficulty managing your finances?n Do you have any difficulties with housekeeping of managing your housekeeping?n   Do you have Advanced Directives in place (Living Will, Healthcare Power or Attorney)? No, but is working on that with an attorney   Last eye Exam and location? [facility] [place] went in January   Do you currently use prescribed or non-prescribed narcotic or opioid pain medications?n  Do you have a history or close family history of breast, ovarian, tubal or peritoneal cancer or a family member with BRCA (breast cancer susceptibility 1 and 2) gene mutations?    ----------------------------------------------------------------------------------------------------------------------------------------------------------------------------------------------------------------------  Because this visit was a virtual/telehealth visit, some criteria may be missing or patient reported. Any vitals not documented were not able to be obtained and vitals that have been documented are patient reported.    MEDICARE ANNUAL PREVENTIVE CARE VISIT WITH PROVIDER (Welcome to Medicare, initial annual wellness or annual wellness exam)  Virtual Visit via Video Note  I connected with [name] [name] on 10/13/23  by a  video enabled telemedicine application and verified that I am speaking with the correct person using two  identifiers.  Location patient: home Location provider:work or home office Persons participating in the virtual visit: patient, provider  Concerns and/or follow up today: no new concerns, but he was a little concerned about the WBC count on his last CBC. No symptoms, reports feels great and neg ROS per below.    See HM section in Epic for other details of completed HM.    ROS: negative for report of fevers, unintentional weight loss, vision changes, vision loss, hearing loss or change, chest pain, sob, hemoptysis, melena, hematochezia, hematuria, falls, bleeding or bruising, thoughts of suicide or self harm, memory loss  Patient-completed extensive health risk assessment - reviewed and discussed with the patient: See Health Risk Assessment completed with patient prior to the visit either above or in recent phone note. This was reviewed in detailed with the patient today and appropriate recommendations, orders and referrals were placed as needed per Summary below and patient instructions.   Review of Medical History: -PMH, PSH, Family History and current specialty and care providers reviewed and updated and listed below   Patient Care Team: Nelwyn Salisbury, MD as PCP - General (Family Medicine)   Past Medical History:  Diagnosis Date   Basal cell carcinoma (BCC) of upper lip 09/2018   Basal cell carcinoma of face    4 of the face   CARCINOMA, BASAL CELL, RECURRENT 09/07/2008   sees Dr. 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 Leone Payor, sigmoid diverticula, no polyps, repeat in 10 yrs   Exploration for Undescended testicle      age 6   fractured patella     teenager   fractured wrist      at 59   INGUINAL HERNIA REPAIR     left at age 30 (concomittently with exploratory surgery)   TONSILLECTOMY AND ADENOIDECTOMY     age 60    Social History   Socioeconomic History   Marital status: Married    Spouse name: Not on file   Number of children: Not on file   Years of education: Not on file   Highest education level: Master's degree (e.g., MA, MS, MEng, MEd, MSW, MBA)  Occupational History   Occupation: Corporate investment banker Health [employer]  Tobacco Use   Smoking status: Former    Current packs/day: 0.00    Types: Cigarettes    Quit date: 04/09/1977    Years since quitting: 46.5   Smokeless tobacco: Never  Vaping Use   Vaping status: Never Used  Substance and Sexual Activity   Alcohol use: Yes    Alcohol/week: 12.0 standard drinks of alcohol    Types: 12 Standard drinks or equivalent per week    Comment: moderate--2 beers daily   Drug use: No   Sexual activity: Yes    Partners: Female  Other Topics Concern   Not on file  Social History Narrative   [place]-degree Scientist, clinical (histocompatibility and immunogenetics); MBA [employer]. Married '79. 1 daughter -'54 (nurse), 1 son '88. [place] [employer]. 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 Marriage in good health      Some  beer drinking, former smoker no drug use   Social Drivers of Corporate investment banker Strain: Low Risk  (08/28/2023)   Overall Financial Resource Strain (CARDIA)    Difficulty of Paying Living Expenses: Not hard at all  Food Insecurity: No Food Insecurity (08/28/2023)   Hunger Vital Sign    Worried About Running Out of Food in the Last Year: Never true    Ran Out of Food in the Last Year: Never true  Transportation Needs: No Transportation Needs (08/28/2023)   PRAPARE - Administrator, Civil Service (Medical): No    Lack of Transportation (Non-Medical): No  Physical Activity: Sufficiently Active (08/28/2023)   Exercise Vital Sign    Days of Exercise per Week: 5 days    Minutes  of Exercise per Session: 60 min  Stress: No Stress Concern Present (08/28/2023)   [employer] of Occupational Health - Occupational Stress Questionnaire    Feeling of Stress : Not at all  Social Connections: Socially Integrated (08/28/2023)   Social Connection and Isolation Panel [NHANES]    Frequency of Communication with Friends and Family: More than three times a week    Frequency of Social Gatherings with Friends and Family: More than three times a week    Attends Religious Services: More than 4 times per year    Active Member of [employer] or Organizations: Yes    Attends Engineer, structural: More than 4 times per year    Marital Status: Married  Catering manager Violence: Not At Risk (08/21/2022)   Humiliation, Afraid, Rape, and Kick questionnaire    Fear of Current or Ex-Partner: No    Emotionally Abused: No    Physically Abused: No    Sexually Abused: No    Family History  Problem Relation Age of Onset   Hyperlipidemia Mother    Hypertension Father    Atrial fibrillation Father    Obesity Father    Heart disease Father        a. fib   Cancer Maternal Grandmother        Uterine   Cancer Paternal Grandmother        Breast   Stroke Paternal Grandmother    Hyperlipidemia Other    Diabetes Other    Colon cancer Neg Hx    Colon polyps Neg Hx     Current Outpatient Medications on File Prior to Visit  Medication Sig Dispense Refill   AMBULATORY NON FORMULARY MEDICATION 20 mg 2 (two) times daily. CBD oil     atorvastatin (LIPITOR) 10 MG tablet Take 1 tablet (10 mg total) by mouth daily. 90 tablet 3   diclofenac Sodium (PENNSAID) 2 % SOLN Place 2 Pump (40 mg total) onto the skin 2 (two) times daily. 112 g 1   doxycycline (VIBRAMYCIN) 100 MG capsule Take 1 capsule (100 mg total) by mouth 2 (two) times daily. 180 capsule 3   sildenafil (VIAGRA) 100 MG tablet Take 1 tablet (100 mg total) by mouth as needed for erectile dysfunction. 10 tablet 5   Testosterone 25  MG/2.5GM (1%) GEL Apply 2 packets topically daily. 450 g 1   triamcinolone (NASACORT) 55 MCG/ACT AERO nasal inhaler      typhoid (VIVOTIF) DR capsule Take 1 capsule by mouth every other day. 4 capsule 0   No current facility-administered medications on file prior to visit.    Allergies  Allergen Reactions   Amoxicillin-Pot Clavulanate     REACTION: casues upset stomach  Physical Exam Vitals requested from patient and listed below if patient had equipment and was able to obtain at home for this virtual visit: There were no vitals filed for this visit. Estimated body mass index is 27.76 kg/m as calculated from the following:   Height as of 08/28/23: 5\' 9"  (1.753 m).   Weight as of 08/28/23: 188 lb (85.3 kg).  EKG (optional): deferred due to virtual visit  GENERAL: alert, oriented, no acute distress detected; full vision exam deferred due to pandemic and/or virtual encounter  HEENT: atraumatic, conjunttiva clear, no obvious abnormalities on inspection of external nose and ears  NECK: normal movements of the head and neck  LUNGS: on inspection no signs of respiratory distress, breathing rate appears normal, no obvious gross SOB, gasping or wheezing  CV: no obvious cyanosis  MS: moves all visible extremities without noticeable abnormality  PSYCH/NEURO: pleasant and cooperative, no obvious depression or anxiety, speech and thought processing grossly intact, Cognitive function grossly intact  Flowsheet Row Clinical Support from 08/15/2021 in [facility]  PHQ-9 Total Score 0           10/13/2023    1:16 PM 08/21/2022   10:34 AM 08/15/2021   10:31 AM 07/04/2021    9:09 AM 05/09/2021   11:27 AM  Depression screen PHQ 2/9  Decreased Interest 0 0 0 0 0  Down, Depressed, Hopeless 0 0 0 0 0  PHQ - 2 Score 0 0 0 0 0  Altered sleeping   0 1 1  Tired, decreased energy   0 0 0  Change in appetite   0 0 0  Feeling bad or failure about yourself     0 0 0  Trouble concentrating   0 0 0  Moving slowly or fidgety/restless   0 0 0  Suicidal thoughts   0 0 0  PHQ-9 Score   0 1 1  Difficult doing work/chores   Not difficult at all Not difficult at all Not difficult at all       08/15/2021    9:32 AM 08/15/2021   10:37 AM 08/21/2022   10:36 AM 02/25/2023    9:39 AM 10/13/2023    1:16 PM  Fall Risk  Falls in the past year? 0 0 0 0 0  Was there an injury with Fall?  0 0  0  Fall Risk Category Calculator  0 0  0  Fall Risk Category (Retired)  Low Low    (RETIRED) Patient Fall Risk Level  Low fall risk Low fall risk    Patient at Risk for Falls Due to   No Fall Risks    Fall risk Follow up   Falls prevention discussed  Falls evaluation completed     SUMMARY AND PLAN:  Encounter for Medicare annual wellness exam  Discussed applicable health maintenance/preventive health measures and advised and referred or ordered per patient preferences: -discussed vaccines due/risks and recs, he knows can get at pharmacy if wishes to do Health Maintenance  Topic Date Due   Zoster Vaccines- Shingrix (1 of 2) 08/29/1973   COVID-19 Vaccine (8 - 2024-25 season) 06/10/2023   Medicare Annual Wellness (AWV)  10/12/2024   Colonoscopy  04/08/2025   DTaP/Tdap/Td (2 - Tdap) 08/08/2026   Pneumonia Vaccine 42+ Years old  Completed   INFLUENZA VACCINE  Completed   Hepatitis C Screening  Completed   HPV VACCINES  Aged [employer] and counseling on the following  was provided based on the above review of health and a plan/checklist for the patient, along with additional information discussed, was provided for the patient in the patient instructions :  -advised of importance of advanced directives - he plans to do with his attorney -advised follow up with PCP in 1-2 months for recheck and discussion on lab abnormalities. Sent message to schedulers to assist in scheduling and asked pt to call. Discussed various potential causes of mild chronic  leukopenia. Testosterone, age, alcohol use all could contribute.  -Advised and counseled on a healthy lifestyle - including the importance of a healthy diet, regular physical activity, social connections  -Reviewed patient's current diet. Advised and counseled on a whole foods based healthy diet. A summary of a healthy diet was provided in the Patient Instructions.  -reviewed patient's current physical activity level and discussed exercise guidelines for adults. Discussed community resources and ideas for safe exercise at home to assist in meeting exercise guideline recommendations in a safe and healthy way.  -Advise yearly dental visits at minimum and regular eye exams -Advised and counseled on alcohol safe limits, risks - advised to cut back to no more than 2 drinks per any given 24 hour period. He says he does not anticipate any difficulty in cutting back.   Follow up: see patient instructions   Patient Instructions  I really enjoyed getting to talk with you today! I am available on Tuesdays and Thursdays for virtual visits if you have any questions or concerns, or if I can be of any further assistance.   CHECKLIST FROM ANNUAL WELLNESS VISIT:  -Follow up (please call to schedule if not scheduled after visit):   -please schedule follow up with Dr. Clent Ridges in 1-2 months about the blood cell counts   -yearly for annual wellness visit with primary care office  Here is a list of your preventive care/health maintenance measures and the plan for each if any are due:  PLAN For any measures below that may be due:  -can get vaccines at the pharmacy  Health Maintenance  Topic Date Due   Zoster Vaccines- Shingrix (1 of 2) 08/29/1973   COVID-19 Vaccine (8 - 2024-25 season) 06/10/2023   Medicare Annual Wellness (AWV)  10/12/2024   Colonoscopy  04/08/2025   DTaP/Tdap/Td (2 - Tdap) 08/08/2026   Pneumonia Vaccine 75+ Years old  Completed   INFLUENZA VACCINE  Completed   Hepatitis C Screening   Completed   HPV VACCINES  Aged Out    -See a dentist at least yearly  -Get your eyes checked and then per your eye specialist's recommendations  -Other issues addressed today:   -please cut back to no more than 2 alcoholic beverages per any given 24 hour period  -I have included below further information regarding a healthy whole foods based diet, physical activity guidelines for adults, stress management and opportunities for social connections. I hope you find this information useful.   -----------------------------------------------------------------------------------------------------------------------------------------------------------------------------------------------------------------------------------------------------------    NUTRITION: -eat real food: lots of colorful vegetables (half the plate) and fruits -5-7 servings of vegetables and fruits per day (fresh or steamed is best), exp. 2 servings of vegetables with lunch and dinner and 2 servings of fruit per day. Berries and greens such as kale and collards are great choices.  -consume on a regular basis:  fresh fruits, fresh veggies, fish, nuts, seeds, healthy oils (such as olive oil, avocado oil), whole grains (make sure for bread/pasta/crackers/etc., that the first ingredient on label contains the word "whole"),  legumes. -can eat small amounts of dairy and lean meat (no larger than the palm of your hand), but avoid processed meats such as ham, bacon, lunch meat, etc. -drink water -try to avoid fast food and pre-packaged foods, processed meat, ultra processed foods/beverages (donuts, candy, etc.) -most experts advise limiting sodium to < 2300mg  per day, should limit further is any chronic conditions such as high blood pressure, heart disease, diabetes, etc. The American Heart Association advised that < 1500mg  is is ideal -try to avoid foods/beverages that contain any ingredients with names you do not recognize  -try to  avoid foods/beverages  with added sugar or sweeteners/sweets  -try to avoid sweet drinks (including diet drinks): soda, juice, Gatorade, sweet tea, power drinks, diet drinks -try to avoid white rice, white bread, pasta (unless whole grain)  EXERCISE GUIDELINES FOR ADULTS: -if you wish to increase your physical activity, do so gradually and with the approval of your doctor -STOP and seek medical care immediately if you have any chest pain, chest discomfort or trouble breathing when starting or increasing exercise  -move and stretch your body, legs, feet and arms when sitting for long periods -Physical activity guidelines for optimal health in adults: -get at least 150 minutes per week of moderate exercise (can talk, but not sing); this is about 20-30 minutes of sustained activity 5-7 days per week or two 10-15 minute episodes of sustained activity 5-7 days per week -do some muscle building/resistance training/strength training at least 2 days per week  -balance exercises 3+ days per week:   Stand somewhere where you have something sturdy to hold onto if you lose balance    1) lift up on toes, then back down, start with 5x per day and work up to 20x   2) stand and lift one leg straight out to the side so that foot is a few inches of the floor, start with 5x each side and work up to 20x each side   3) stand on one foot, start with 5 seconds each side and work up to 20 seconds on each side  If you need ideas or help with getting more active:  -Silver sneakers https://tools.silversneakers.com  -Walk with a Doc: http://www.duncan-williams.com/  -try to include resistance (weight lifting/strength building) and balance exercises twice per week: or the following link for ideas: http://castillo-powell.com/  BuyDucts.dk  STRESS MANAGEMENT: -can try meditating, or just sitting quietly with deep breathing while  intentionally relaxing all parts of your body for 5 minutes daily -if you need further help with stress, anxiety or depression please follow up with your primary doctor or contact the wonderful folks at [facility]: [phone]  SOCIAL CONNECTIONS: -options in [place] if you wish to engage in more social and exercise related activities:  -Silver sneakers https://tools.silversneakers.com  -Walk with a Doc: http://www.duncan-williams.com/  -Check out the [facility] Active Adults 50+ section on the [place] [employer] (hiking clubs, book clubs, cards and games, chess, exercise classes, aquatic classes and much more) - see the website for details: https://www.[place].gov/departments/parks-recreation/active-adults50  -YouTube has lots of exercise videos for different ages and abilities as well  -[facility] (a variety of indoor and outdoor inperson activities for adults). [phone]. [address].  -Virtual Online Classes (a variety of topics): see seniorplanet.org or call [phone]  -consider volunteering at a school, hospice center, church, senior center or elsewhere   ADVANCED HEALTHCARE DIRECTIVES:  [place] Advanced Directives assistance:   ExpressWeek.com.cy  Everyone should have advanced health care  directives in place. This is so that you get the care you want, should you ever be in a situation where you are unable to make your own medical decisions.   From the [place] Advanced Directive Website: "Advance Health Care Directives are legal documents in which you give written instructions about your health care if, in the future, you cannot speak for yourself.   A health care power of attorney allows you to name a person you trust to make your health care decisions if you cannot make them yourself. A declaration of a desire for a natural death (or living will) is document, which states  that you desire not to have your life prolonged by extraordinary measures if you have a terminal or incurable illness or if you are in a vegetative state. An advance instruction for mental health treatment makes a declaration of instructions, information and preferences regarding your mental health treatment. It also states that you are aware that the advance instruction authorizes a mental health treatment provider to act according to your wishes. It may also outline your consent or refusal of mental health treatment. A declaration of an anatomical gift allows anyone over the age of 22 to make a gift by will, organ donor card or other document."   Please see the following website or an elder law attorney for forms, FAQs and for completion of advanced directives: [place] TEFL teacher Health Care Directives Advance Health Care Directives (http://guzman.com/)  Or copy and paste the following to your web browser: PoshChat.fi           Terressa Koyanagi, DO

## 2023-10-13 NOTE — Patient Instructions (Signed)
 I really enjoyed getting to talk with you today! I am available on Tuesdays and Thursdays for virtual visits if you have any questions or concerns, or if I can be of any further assistance.   CHECKLIST FROM ANNUAL WELLNESS VISIT:  -Follow up (please call to schedule if not scheduled after visit):   -please schedule follow up with Dr. Clent Ridges in 1-2 months about the blood cell counts   -yearly for annual wellness visit with primary care office  Here is a list of your preventive care/health maintenance measures and the plan for each if any are due:  PLAN For any measures below that may be due:  -can get vaccines at the pharmacy  Health Maintenance  Topic Date Due   Zoster Vaccines- Shingrix (1 of 2) 08/29/1973   COVID-19 Vaccine (8 - 2024-25 season) 06/10/2023   Medicare Annual Wellness (AWV)  10/12/2024   Colonoscopy  04/08/2025   DTaP/Tdap/Td (2 - Tdap) 08/08/2026   Pneumonia Vaccine 30+ Years old  Completed   INFLUENZA VACCINE  Completed   Hepatitis C Screening  Completed   HPV VACCINES  Aged Out    -See a dentist at least yearly  -Get your eyes checked and then per your eye specialist's recommendations  -Other issues addressed today:   -please cut back to no more than 2 alcoholic beverages per any given 24 hour period  -I have included below further information regarding a healthy whole foods based diet, physical activity guidelines for adults, stress management and opportunities for social connections. I hope you find this information useful.   -----------------------------------------------------------------------------------------------------------------------------------------------------------------------------------------------------------------------------------------------------------    NUTRITION: -eat real food: lots of colorful vegetables (half the plate) and fruits -5-7 servings of vegetables and fruits per day (fresh or steamed is best), exp. 2 servings of  vegetables with lunch and dinner and 2 servings of fruit per day. Berries and greens such as kale and collards are great choices.  -consume on a regular basis:  fresh fruits, fresh veggies, fish, nuts, seeds, healthy oils (such as olive oil, avocado oil), whole grains (make sure for bread/pasta/crackers/etc., that the first ingredient on label contains the word "whole"), legumes. -can eat small amounts of dairy and lean meat (no larger than the palm of your hand), but avoid processed meats such as ham, bacon, lunch meat, etc. -drink water -try to avoid fast food and pre-packaged foods, processed meat, ultra processed foods/beverages (donuts, candy, etc.) -most experts advise limiting sodium to < 2300mg  per day, should limit further is any chronic conditions such as high blood pressure, heart disease, diabetes, etc. The American Heart Association advised that < 1500mg  is is ideal -try to avoid foods/beverages that contain any ingredients with names you do not recognize  -try to avoid foods/beverages  with added sugar or sweeteners/sweets  -try to avoid sweet drinks (including diet drinks): soda, juice, Gatorade, sweet tea, power drinks, diet drinks -try to avoid white rice, white bread, pasta (unless whole grain)  EXERCISE GUIDELINES FOR ADULTS: -if you wish to increase your physical activity, do so gradually and with the approval of your doctor -STOP and seek medical care immediately if you have any chest pain, chest discomfort or trouble breathing when starting or increasing exercise  -move and stretch your body, legs, feet and arms when sitting for long periods -Physical activity guidelines for optimal health in adults: -get at least 150 minutes per week of moderate exercise (can talk, but not sing); this is about 20-30 minutes of sustained activity 5-7 days per  week or two 10-15 minute episodes of sustained activity 5-7 days per week -do some muscle building/resistance training/strength training  at least 2 days per week  -balance exercises 3+ days per week:   Stand somewhere where you have something sturdy to hold onto if you lose balance    1) lift up on toes, then back down, start with 5x per day and work up to 20x   2) stand and lift one leg straight out to the side so that foot is a few inches of the floor, start with 5x each side and work up to 20x each side   3) stand on one foot, start with 5 seconds each side and work up to 20 seconds on each side  If you need ideas or help with getting more active:  -Silver sneakers https://tools.silversneakers.com  -Walk with a Doc: http://www.duncan-williams.com/  -try to include resistance (weight lifting/strength building) and balance exercises twice per week: or the following link for ideas: http://castillo-powell.com/  BuyDucts.dk  STRESS MANAGEMENT: -can try meditating, or just sitting quietly with deep breathing while intentionally relaxing all parts of your body for 5 minutes daily -if you need further help with stress, anxiety or depression please follow up with your primary doctor or contact the wonderful folks at [facility]: [phone]  SOCIAL CONNECTIONS: -options in [place] if you wish to engage in more social and exercise related activities:  -Silver sneakers https://tools.silversneakers.com  -Walk with a Doc: http://www.duncan-williams.com/  -Check out the [facility] Active Adults 50+ section on the [place] of [employer] (hiking clubs, book clubs, cards and games, chess, exercise classes, aquatic classes and much more) - see the website for details: https://www.[place].gov/departments/parks-recreation/active-adults50  -YouTube has lots of exercise videos for different ages and abilities as well  -[facility] (a variety of indoor and outdoor inperson activities for adults). (782)112-3014. [address]  -Virtual Online Classes (a variety of topics): see seniorplanet.org or call [phone]  -consider volunteering at a school, hospice center, church, senior center or elsewhere   ADVANCED HEALTHCARE DIRECTIVES:  [place] Advanced Directives assistance:   ExpressWeek.com.cy  Everyone should have advanced health care directives in place. This is so that you get the care you want, should you ever be in a situation where you are unable to make your own medical decisions.   From the  Advanced Directive Website: "Advance Health Care Directives are legal documents in which you give written instructions about your health care if, in the future, you cannot speak for yourself.   A health care power of attorney allows you to name a person you trust to make your health care decisions if you cannot make them yourself. A declaration of a desire for a natural death (or living will) is document, which states that you desire not to have your life prolonged by extraordinary measures if you have a terminal or incurable illness or if you are in a vegetative state. An advance instruction for mental health treatment makes a declaration of instructions, information and preferences regarding your mental health treatment. It also states that you are aware that the advance instruction authorizes a mental health treatment provider to act according to your wishes. It may also outline your consent or refusal of mental health treatment. A declaration of an anatomical gift allows anyone over the age of 41 to make a gift by will, organ donor card or other document."   Please see the following website or an elder law attorney for forms, FAQs and  for completion of advanced directives: [place] TEFL teacher Health Care Directives Advance Health Care Directives (http://guzman.com/)  Or copy and paste the following to your web  browser: PoshChat.fi

## 2023-11-13 ENCOUNTER — Other Ambulatory Visit: Payer: Self-pay | Admitting: Family Medicine

## 2023-11-16 MED ORDER — TYPHOID VACCINE PO CPDR: 1.0000 | DELAYED_RELEASE_CAPSULE | ORAL | 0 refills | Status: DC

## 2023-11-25 ENCOUNTER — Ambulatory Visit (INDEPENDENT_AMBULATORY_CARE_PROVIDER_SITE_OTHER): Payer: Medicare HMO | Admitting: Family Medicine

## 2023-11-25 ENCOUNTER — Encounter: Payer: Self-pay | Admitting: Family Medicine

## 2023-11-25 VITALS — BP 108/68 | HR 54 | Temp 98.3°F | Wt 192.0 lb

## 2023-11-25 DIAGNOSIS — D709 Neutropenia, unspecified: Secondary | ICD-10-CM | POA: Diagnosis not present

## 2023-11-25 LAB — CBC WITH DIFFERENTIAL/PLATELET
Basophils Absolute: 0 10*3/uL (ref 0.0–0.1)
Basophils Relative: 0.5 % (ref 0.0–3.0)
Eosinophils Absolute: 0 10*3/uL (ref 0.0–0.7)
Eosinophils Relative: 1 % (ref 0.0–5.0)
HCT: 43.7 % (ref 39.0–52.0)
Hemoglobin: 14.9 g/dL (ref 13.0–17.0)
Lymphocytes Relative: 37.3 % (ref 12.0–46.0)
Lymphs Abs: 1.4 10*3/uL (ref 0.7–4.0)
MCHC: 34 g/dL (ref 30.0–36.0)
MCV: 95.8 fl (ref 78.0–100.0)
Monocytes Absolute: 0.4 10*3/uL (ref 0.1–1.0)
Monocytes Relative: 11.1 % (ref 3.0–12.0)
Neutro Abs: 1.9 10*3/uL (ref 1.4–7.7)
Neutrophils Relative %: 50.1 % (ref 43.0–77.0)
Platelets: 165 10*3/uL (ref 150.0–400.0)
RBC: 4.56 Mil/uL (ref 4.22–5.81)
RDW: 13.2 % (ref 11.5–15.5)
WBC: 3.7 10*3/uL — ABNORMAL LOW (ref 4.0–10.5)

## 2023-11-25 NOTE — Progress Notes (Signed)
   Subjective:    Patient ID: [name] [name], male    DOB: 1956/02/02, 68 y.o.   MRN: [id number]  HPI Here to follow up on neutropenia. His WBC had been in the 4-5 range, but this dropped to 3.8 a year ago, and then it was 3.1 three months ago. Thre differential has remained normal. He feels well.    Review of Systems  Constitutional: Negative.   Respiratory: Negative.    Cardiovascular: Negative.        Objective:   Physical Exam Constitutional:      Appearance: Normal appearance.  Cardiovascular:     Rate and Rhythm: Normal rate and regular rhythm.     Pulses: Normal pulses.     Heart sounds: Normal heart sounds.  Pulmonary:     Effort: Pulmonary effort is normal.     Breath sounds: Normal breath sounds.  Neurological:     Mental Status: He is alert.           Assessment & Plan:  Neutropenia. We will recheck a CBC today.  Gershon Crane, MD

## 2023-11-27 NOTE — Addendum Note (Signed)
 Addended by: Johnella Moloney on: 11/27/2023 02:52 PM   Modules accepted: Orders

## 2024-02-06 ENCOUNTER — Other Ambulatory Visit: Payer: Self-pay | Admitting: Family Medicine

## 2024-02-08 ENCOUNTER — Other Ambulatory Visit: Payer: Self-pay | Admitting: Family Medicine

## 2024-02-25 ENCOUNTER — Other Ambulatory Visit (INDEPENDENT_AMBULATORY_CARE_PROVIDER_SITE_OTHER)

## 2024-02-25 DIAGNOSIS — D709 Neutropenia, unspecified: Secondary | ICD-10-CM

## 2024-02-25 LAB — CBC WITH DIFFERENTIAL/PLATELET
Basophils Absolute: 0 K/uL (ref 0.0–0.1)
Basophils Relative: 0.3 % (ref 0.0–3.0)
Eosinophils Absolute: 0 K/uL (ref 0.0–0.7)
Eosinophils Relative: 1 % (ref 0.0–5.0)
HCT: 43.2 % (ref 39.0–52.0)
Hemoglobin: 14.5 g/dL (ref 13.0–17.0)
Lymphocytes Relative: 35 % (ref 12.0–46.0)
Lymphs Abs: 1.5 K/uL (ref 0.7–4.0)
MCHC: 33.6 g/dL (ref 30.0–36.0)
MCV: 95.3 fl (ref 78.0–100.0)
Monocytes Absolute: 0.5 K/uL (ref 0.1–1.0)
Monocytes Relative: 12.9 % — ABNORMAL HIGH (ref 3.0–12.0)
Neutro Abs: 2.1 K/uL (ref 1.4–7.7)
Neutrophils Relative %: 50.8 % (ref 43.0–77.0)
Platelets: 180 K/uL (ref 150.0–400.0)
RBC: 4.53 Mil/uL (ref 4.22–5.81)
RDW: 13.1 % (ref 11.5–15.5)
WBC: 4.2 K/uL (ref 4.0–10.5)

## 2024-02-26 ENCOUNTER — Ambulatory Visit: Payer: Self-pay | Admitting: Family Medicine

## 2024-05-04 ENCOUNTER — Other Ambulatory Visit: Payer: Self-pay | Admitting: Family Medicine

## 2024-05-06 ENCOUNTER — Telehealth: Payer: Self-pay | Admitting: Internal Medicine

## 2024-05-06 NOTE — Telephone Encounter (Signed)
 Inbound call from patient stating he needs to be seen by next week by Dr.Gessner due to IBS and he can not wait for next availability in December or November with a PA. Patient stated he didn't know what I needed to do for him to be seen but he needs an appointment soon. Please advise  Thank you

## 2024-05-09 NOTE — Telephone Encounter (Signed)
 I was able to speak with the pt and offer appt for 9/24 with Delon and he tells me that he is 100% better and does not wish to make an appt at this time but will call back if symptoms return.

## 2024-05-17 ENCOUNTER — Encounter: Payer: Self-pay | Admitting: Family Medicine

## 2024-08-22 ENCOUNTER — Ambulatory Visit: Admitting: Family Medicine

## 2024-08-22 VITALS — BP 122/78 | HR 54 | Ht 69.0 in | Wt 192.0 lb

## 2024-08-22 DIAGNOSIS — G8929 Other chronic pain: Secondary | ICD-10-CM

## 2024-08-22 DIAGNOSIS — M545 Low back pain, unspecified: Secondary | ICD-10-CM | POA: Diagnosis not present

## 2024-08-22 NOTE — Patient Instructions (Addendum)
 Thank you for coming in today.   Work on [employer]  Let me know if you need physical therapy

## 2024-08-22 NOTE — Progress Notes (Signed)
"       ° °  LILLETTE Ileana Collet, PhD, LAT, ATC acting as a scribe for Artist Lloyd, MD.  [name] [name] is a 69 y.o. male who presents to [facility] today for back pain. Pt was last seen by Dr. Lloyd on 09/22/22 for lumbar radiculitis.  Today, pt reports around the end of Nov he traveled to [place]  doing mission work building a house. Once he returned, LBP returned, around 12/22. Pain was located on the R-side of his low back w/ radiating pain into the R hip. Pain seems to be mostly resolved but wants to discuss how to maintain and cont to be active.  Treatments tried: CBD balm, stretching, tylenol, ice,   Dx imaging: 09/22/22 L-spine XR  Pertinent review of systems: No fevers or chills  Relevant historical information: History of back pain   Exam:  BP 122/78   Pulse (!) 54   Ht 5' 9 (1.753 m)   Wt 192 lb (87.1 kg)   SpO2 98%   BMI 28.35 kg/m  General: Well Developed, well nourished, and in no acute distress.   MSK: L-spine nontender to palpation midline decreased lumbar motion lower extremity strength is intact.    Lab and Radiology Results No results found for this or any previous visit (from the past 72 hours). No results found.     Assessment and Plan: 69 y.o. male with acute exacerbation of chronic right low back pain.  He does have some degenerative changes on x-ray about 2 years ago.  His pain is already improving and was secondary to muscle dysfunction and spasm after increasing activity level.  Plan for a bit of watchful waiting and home exercise program.  If needed would refer back to physical therapy. [facility]   [address] [place], [place] [address] Phone: [phone] Fax: [phone]   PDMP not reviewed this encounter. No orders of the defined types were placed in this encounter.  No orders of the defined types were placed in this encounter.    Discussed warning signs or symptoms. Please see discharge  instructions. Patient expresses understanding.   The above documentation has been reviewed and is accurate and complete Artist Lloyd, M.D.   "

## 2024-08-24 ENCOUNTER — Ambulatory Visit: Admitting: Family Medicine

## 2024-08-29 ENCOUNTER — Encounter: Payer: Self-pay | Admitting: Family Medicine

## 2024-08-29 ENCOUNTER — Ambulatory Visit: Payer: Self-pay | Admitting: Family Medicine

## 2024-08-29 ENCOUNTER — Ambulatory Visit: Admitting: Family Medicine

## 2024-08-29 VITALS — BP 118/68 | HR 53 | Temp 98.0°F | Ht 68.0 in | Wt 191.0 lb

## 2024-08-29 DIAGNOSIS — N138 Other obstructive and reflux uropathy: Secondary | ICD-10-CM

## 2024-08-29 DIAGNOSIS — D709 Neutropenia, unspecified: Secondary | ICD-10-CM | POA: Diagnosis not present

## 2024-08-29 DIAGNOSIS — R739 Hyperglycemia, unspecified: Secondary | ICD-10-CM | POA: Diagnosis not present

## 2024-08-29 DIAGNOSIS — E785 Hyperlipidemia, unspecified: Secondary | ICD-10-CM | POA: Diagnosis not present

## 2024-08-29 DIAGNOSIS — N401 Enlarged prostate with lower urinary tract symptoms: Secondary | ICD-10-CM | POA: Diagnosis not present

## 2024-08-29 DIAGNOSIS — E291 Testicular hypofunction: Secondary | ICD-10-CM

## 2024-08-29 LAB — HEPATIC FUNCTION PANEL
ALT: 9 U/L (ref 3–53)
AST: 21 U/L (ref 5–37)
Albumin: 4.8 g/dL (ref 3.5–5.2)
Alkaline Phosphatase: 57 U/L (ref 39–117)
Bilirubin, Direct: 0.1 mg/dL (ref 0.1–0.3)
Total Bilirubin: 0.7 mg/dL (ref 0.2–1.2)
Total Protein: 7.2 g/dL (ref 6.0–8.3)

## 2024-08-29 LAB — LIPID PANEL
Cholesterol: 176 mg/dL (ref 28–200)
HDL: 75.2 mg/dL
LDL Cholesterol: 84 mg/dL (ref 10–99)
NonHDL: 101.14
Total CHOL/HDL Ratio: 2
Triglycerides: 87 mg/dL (ref 10.0–149.0)
VLDL: 17.4 mg/dL (ref 0.0–40.0)

## 2024-08-29 LAB — BASIC METABOLIC PANEL WITH GFR
BUN: 14 mg/dL (ref 6–23)
CO2: 29 meq/L (ref 19–32)
Calcium: 9.3 mg/dL (ref 8.4–10.5)
Chloride: 101 meq/L (ref 96–112)
Creatinine, Ser: 1.03 mg/dL (ref 0.40–1.50)
GFR: 73.86 mL/min
Glucose, Bld: 89 mg/dL (ref 70–99)
Potassium: 4.1 meq/L (ref 3.5–5.1)
Sodium: 138 meq/L (ref 135–145)

## 2024-08-29 LAB — CBC WITH DIFFERENTIAL/PLATELET
Basophils Absolute: 0 K/uL (ref 0.0–0.1)
Basophils Relative: 0.6 % (ref 0.0–3.0)
Eosinophils Absolute: 0 K/uL (ref 0.0–0.7)
Eosinophils Relative: 1.1 % (ref 0.0–5.0)
HCT: 43.9 % (ref 39.0–52.0)
Hemoglobin: 15.1 g/dL (ref 13.0–17.0)
Lymphocytes Relative: 36.2 % (ref 12.0–46.0)
Lymphs Abs: 1.3 K/uL (ref 0.7–4.0)
MCHC: 34.4 g/dL (ref 30.0–36.0)
MCV: 94.6 fl (ref 78.0–100.0)
Monocytes Absolute: 0.4 K/uL (ref 0.1–1.0)
Monocytes Relative: 12.1 % — ABNORMAL HIGH (ref 3.0–12.0)
Neutro Abs: 1.9 K/uL (ref 1.4–7.7)
Neutrophils Relative %: 50 % (ref 43.0–77.0)
Platelets: 175 K/uL (ref 150.0–400.0)
RBC: 4.64 Mil/uL (ref 4.22–5.81)
RDW: 13.3 % (ref 11.5–15.5)
WBC: 3.7 K/uL — ABNORMAL LOW (ref 4.0–10.5)

## 2024-08-29 LAB — TSH: TSH: 1.87 u[IU]/mL (ref 0.35–5.50)

## 2024-08-29 LAB — TESTOSTERONE: Testosterone: 297.11 ng/dL — ABNORMAL LOW (ref 300.00–890.00)

## 2024-08-29 LAB — PSA: PSA: 0.88 ng/mL (ref 0.10–4.00)

## 2024-08-29 LAB — HEMOGLOBIN A1C: Hgb A1c MFr Bld: 5.6 % (ref 4.6–6.5)

## 2024-08-29 MED ORDER — TESTOSTERONE 50 MG/5GM (1%) TD GEL: 10.0000 g | Freq: Every day | TRANSDERMAL | 1 refills | Status: AC

## 2024-08-29 MED ORDER — SILDENAFIL CITRATE 100 MG PO TABS: 100.0000 mg | ORAL_TABLET | ORAL | 11 refills | Status: AC | PRN

## 2024-08-29 NOTE — Progress Notes (Signed)
 "  Subjective:    Patient ID: [name] [name], male    DOB: Jan 11, 1956, 69 y.o.   MRN: [id number]  HPI Here to follow up on issues. He feels well in general. He is pleased with his testosterone  treatments, and his libido and erections are satisfactory. He watches his diet closely. He will be due for a colonoscopy this summer.    Review of Systems  Constitutional: Negative.   HENT: Negative.    Eyes: Negative.   Respiratory: Negative.    Cardiovascular: Negative.   Gastrointestinal: Negative.   Genitourinary: Negative.   Musculoskeletal: Negative.   Skin: Negative.   Neurological: Negative.   Psychiatric/Behavioral: Negative.         Objective:   Physical Exam Constitutional:      General: He is not in acute distress.    Appearance: Normal appearance. He is well-developed. He is not diaphoretic.  HENT:     Head: Normocephalic and atraumatic.     Right Ear: External ear normal.     Left Ear: External ear normal.     Nose: Nose normal.     Mouth/Throat:     Pharynx: No oropharyngeal exudate.  Eyes:     General: No scleral icterus.       Right eye: No discharge.        Left eye: No discharge.     Conjunctiva/sclera: Conjunctivae normal.     Pupils: Pupils are equal, round, and reactive to light.  Neck:     Thyroid : No thyromegaly.     Vascular: No JVD.     Trachea: No tracheal deviation.  Cardiovascular:     Rate and Rhythm: Normal rate and regular rhythm.     Pulses: Normal pulses.     Heart sounds: Normal heart sounds. No murmur heard.    No friction rub. No gallop.  Pulmonary:     Effort: Pulmonary effort is normal. No respiratory distress.     Breath sounds: Normal breath sounds. No wheezing or rales.  Chest:     Chest wall: No tenderness.  Abdominal:     General: Bowel sounds are normal. There is no distension.     Palpations: Abdomen is soft. There is no mass.     Tenderness: There is no abdominal tenderness. There is no guarding or rebound.   Genitourinary:    Penis: Normal. No tenderness.      Testes: Normal.     Prostate: Normal.     Rectum: Normal. Guaiac result negative.  Musculoskeletal:        General: No tenderness. Normal range of motion.     Cervical back: Neck supple.  Lymphadenopathy:     Cervical: No cervical adenopathy.  Skin:    General: Skin is warm and dry.     Coloration: Skin is not pale.     Findings: No erythema or rash.  Neurological:     General: No focal deficit present.     Mental Status: He is alert and oriented to person, place, and time.     Cranial Nerves: No cranial nerve deficit.     Motor: No abnormal muscle tone.     Coordination: Coordination normal.     Deep Tendon Reflexes: Reflexes are normal and symmetric. Reflexes normal.  Psychiatric:        Mood and Affect: Mood normal.        Behavior: Behavior normal.        Thought Content: Thought content normal.  Judgment: Judgment normal.           Assessment & Plan:  He seems to be doing well. We will continue his testosterone  replacement, and we will check a blood level today. We will also check lipids for his dyslipidemia and a CBC to follow up his neutropenia. I personally spent a total of 32 minutes in the care of the patient today including getting/reviewing separately obtained history, performing a medically appropriate exam/evaluation, counseling and educating, and placing orders .  Garnette Olmsted, MD    "

## 2024-10-14 ENCOUNTER — Ambulatory Visit
